# Patient Record
Sex: Female | Born: 1935 | Race: White | Hispanic: No | Smoking: Never smoker
Health system: Southern US, Community
[De-identification: ages and names within clinical notes are randomized; demographics above are authoritative.]

## PROBLEM LIST (undated history)

## (undated) DIAGNOSIS — F0390 Unspecified dementia without behavioral disturbance: Secondary | ICD-10-CM

## (undated) DIAGNOSIS — G459 Transient cerebral ischemic attack, unspecified: Secondary | ICD-10-CM

## (undated) DIAGNOSIS — I340 Nonrheumatic mitral (valve) insufficiency: Secondary | ICD-10-CM

## (undated) DIAGNOSIS — G4700 Insomnia, unspecified: Secondary | ICD-10-CM

## (undated) DIAGNOSIS — I10 Essential (primary) hypertension: Secondary | ICD-10-CM

## (undated) DIAGNOSIS — M1990 Unspecified osteoarthritis, unspecified site: Secondary | ICD-10-CM

## (undated) DIAGNOSIS — E7800 Pure hypercholesterolemia, unspecified: Secondary | ICD-10-CM

## (undated) HISTORY — PX: ABDOMINAL HYSTERECTOMY: SHX81

## (undated) HISTORY — PX: BREAST BIOPSY: SHX20

---

## 2012-04-16 ENCOUNTER — Emergency Department (HOSPITAL_COMMUNITY): Payer: Medicare Other

## 2012-04-16 ENCOUNTER — Inpatient Hospital Stay (HOSPITAL_COMMUNITY)
Admission: EM | Admit: 2012-04-16 | Discharge: 2012-04-19 | DRG: 202 | Disposition: A | Discharge status: Skilled Nursing Facility | Payer: Medicare Other | Attending: Internal Medicine | Admitting: Internal Medicine

## 2012-04-16 ENCOUNTER — Encounter (HOSPITAL_COMMUNITY): Payer: Self-pay | Admitting: Emergency Medicine

## 2012-04-16 DIAGNOSIS — I441 Atrioventricular block, second degree: Secondary | ICD-10-CM | POA: Diagnosis present

## 2012-04-16 DIAGNOSIS — I214 Non-ST elevation (NSTEMI) myocardial infarction: Secondary | ICD-10-CM | POA: Diagnosis present

## 2012-04-16 DIAGNOSIS — R4182 Altered mental status, unspecified: Secondary | ICD-10-CM

## 2012-04-16 DIAGNOSIS — E871 Hypo-osmolality and hyponatremia: Secondary | ICD-10-CM

## 2012-04-16 DIAGNOSIS — I509 Heart failure, unspecified: Secondary | ICD-10-CM | POA: Diagnosis present

## 2012-04-16 DIAGNOSIS — I10 Essential (primary) hypertension: Secondary | ICD-10-CM

## 2012-04-16 DIAGNOSIS — I959 Hypotension, unspecified: Secondary | ICD-10-CM

## 2012-04-16 DIAGNOSIS — J069 Acute upper respiratory infection, unspecified: Secondary | ICD-10-CM | POA: Diagnosis present

## 2012-04-16 DIAGNOSIS — E785 Hyperlipidemia, unspecified: Secondary | ICD-10-CM

## 2012-04-16 DIAGNOSIS — G459 Transient cerebral ischemic attack, unspecified: Secondary | ICD-10-CM

## 2012-04-16 DIAGNOSIS — I5032 Chronic diastolic (congestive) heart failure: Secondary | ICD-10-CM | POA: Diagnosis present

## 2012-04-16 DIAGNOSIS — F0390 Unspecified dementia without behavioral disturbance: Secondary | ICD-10-CM | POA: Diagnosis present

## 2012-04-16 DIAGNOSIS — J209 Acute bronchitis, unspecified: Principal | ICD-10-CM | POA: Diagnosis present

## 2012-04-16 HISTORY — DX: Insomnia, unspecified: G47.00

## 2012-04-16 HISTORY — DX: Unspecified osteoarthritis, unspecified site: M19.90

## 2012-04-16 HISTORY — DX: Essential (primary) hypertension: I10

## 2012-04-16 HISTORY — DX: Pure hypercholesterolemia, unspecified: E78.00

## 2012-04-16 HISTORY — DX: Unspecified dementia, unspecified severity, without behavioral disturbance, psychotic disturbance, mood disturbance, and anxiety: F03.90

## 2012-04-16 HISTORY — DX: Transient cerebral ischemic attack, unspecified: G45.9

## 2012-04-16 HISTORY — DX: Nonrheumatic mitral (valve) insufficiency: I34.0

## 2012-04-16 LAB — URINE MICROSCOPIC-ADD ON

## 2012-04-16 LAB — URINALYSIS, ROUTINE W REFLEX MICROSCOPIC
Hgb urine dipstick: NEGATIVE
Nitrite: NEGATIVE
Specific Gravity, Urine: 1.012 (ref 1.005–1.030)
Urobilinogen, UA: 0.2 mg/dL (ref 0.0–1.0)

## 2012-04-16 LAB — CBC
Hemoglobin: 11.5 g/dL — ABNORMAL LOW (ref 12.0–15.0)
RBC: 3.79 MIL/uL — ABNORMAL LOW (ref 3.87–5.11)

## 2012-04-16 LAB — BASIC METABOLIC PANEL
GFR calc Af Amer: 51 mL/min — ABNORMAL LOW (ref >90)
GFR calc non Af Amer: 44 mL/min — ABNORMAL LOW (ref >90)
Potassium: 4 mEq/L (ref 3.5–5.1)
Sodium: 125 mEq/L — ABNORMAL LOW (ref 135–145)

## 2012-04-16 LAB — TROPONIN I: Troponin I: <0.3 ng/mL (ref <0.30)

## 2012-04-16 MED ORDER — SODIUM CHLORIDE 0.9 % IV BOLUS (SEPSIS)
1000.0000 mL | Freq: Once | INTRAVENOUS | Status: AC
  Administered 2012-04-16: 1000 mL via INTRAVENOUS

## 2012-04-16 MED ORDER — SODIUM CHLORIDE 0.9 % IJ SOLN
3.0000 mL | Freq: Two times a day (BID) | INTRAMUSCULAR | Status: DC
  Administered 2012-04-17 – 2012-04-18 (×2): 3 mL via INTRAVENOUS

## 2012-04-16 NOTE — ED Provider Notes (Signed)
History     CSN: [id number]  Arrival date & time 04/16/12  1925   First MD Initiated Contact with Patient 04/16/12 1955      Chief Complaint  Patient presents with  . Urinary Incontinence    (Consider location/radiation/quality/duration/timing/severity/associated sxs/prior treatment) HPI Level of level V caveat dementia patient sent here from nursing home as she became incontinent earlier today and has slurred speech per her daughter no treatment prior to coming here. Past Medical History  Diagnosis Date  . Dementia   . TIA (transient ischemic attack)   . Hypertension   . Mitral valve regurgitation   . Hypercholesteremia   . Osteoarthritis   . Insomnia     History reviewed. No pertinent past surgical history.  No family history on file.  History  Substance Use Topics  . Smoking status: Not on file  . Smokeless tobacco: Not on file  . Alcohol Use:     OB History    Grav Para Term Preterm Abortions TAB SAB Ect Mult Living                  Review of Systems  Unable to perform ROS: Dementia    Allergies  Review of patient's allergies indicates no known allergies.  Home Medications   Current Outpatient Rx  Name Route Sig Dispense Refill  . ASPIRIN EC 81 MG PO TBEC Oral Take 81 mg by mouth daily.    Marland Kitchen VITAMIN D 400 UNITS PO CAPS Oral Take 400 Units by mouth 2 (two) times daily.    Marland Kitchen CRANBERRY 425 MG PO CAPS Oral Take 1 capsule by mouth daily.    Marland Kitchen DIVALPROEX SODIUM 250 MG PO TBEC Oral Take 250 mg by mouth 2 (two) times daily.    Marland Kitchen GALANTAMINE HYDROBROMIDE 4 MG PO TABS Oral Take 4 mg by mouth daily.    Marland Kitchen LISINOPRIL-HYDROCHLOROTHIAZIDE 20-25 MG PO TABS Oral Take 1 tablet by mouth daily.    Marland Kitchen LORAZEPAM 0.5 MG PO TABS Oral Take 0.5 mg by mouth every 8 (eight) hours as needed. agitation    . OVER THE COUNTER MEDICATION Perianal Place 1 application around the anus once a week. Mix 1 tablespoon of vinegar to 1 pint water was over perineal area once weekly for  maintenance.    Marland Kitchen POTASSIUM CHLORIDE ER 10 MEQ PO TBCR Oral Take 30 mEq by mouth daily.    Marland Kitchen CIPROFLOXACIN HCL 500 MG PO TABS Oral Take 500 mg by mouth 2 (two) times daily. For 3 days.      BP 83/33  Pulse 74  Temp 99.3 F (37.4 C) (Rectal)  Resp 24  SpO2 96%  Physical Exam  Nursing note and vitals reviewed. Constitutional: She appears well-developed and well-nourished.  HENT:  Head: Normocephalic and atraumatic.  Eyes: Conjunctivae are normal. Pupils are equal, round, and reactive to light.  Neck: Neck supple. No tracheal deviation present. No thyromegaly present.  Cardiovascular: Normal rate and regular rhythm.   No murmur heard. Pulmonary/Chest: Effort normal and breath sounds normal.  Abdominal: Soft. Bowel sounds are normal. She exhibits no distension. There is no tenderness.  Genitourinary: Guaiac negative stool.  Musculoskeletal: Normal range of motion. She exhibits no edema and no tenderness.  Neurological: She is alert. Coordination normal.       Follow simple commands moves all extremities answers in one or 2 word responses  Skin: Skin is warm and dry. No rash noted.  Psychiatric: She has a normal mood and affect.  1120 pm pt alert moves all extremites,remains hypotensive ED Course  Procedures (including critical care time)  Date: 04/16/2012  Rate: 86  Rhythm: normal sinus rhythm  QRS Axis: normal  Intervals: normal  ST/T Wave abnormalities: nonspecific T wave changes  Conduction Disutrbances:none  Narrative Interpretation:   Old EKG Reviewed: none available  Labs Reviewed  TROPONIN I  URINALYSIS, ROUTINE W REFLEX MICROSCOPIC  BASIC METABOLIC PANEL  CBC   No results found.   No diagnosis found.  Results for orders placed during the hospital encounter of 04/16/12  TROPONIN I      Component Value Range   Troponin I <0.30  <0.30 ng/mL  URINALYSIS, ROUTINE W REFLEX MICROSCOPIC      Component Value Range   Color, Urine YELLOW  YELLOW   APPearance  CLEAR  CLEAR   Specific Gravity, Urine 1.012  1.005 - 1.030   pH 7.0  5.0 - 8.0   Glucose, UA NEGATIVE  NEGATIVE mg/dL   Hgb urine dipstick NEGATIVE  NEGATIVE   Bilirubin Urine NEGATIVE  NEGATIVE   Ketones, ur TRACE (*) NEGATIVE mg/dL   Protein, ur 30 (*) NEGATIVE mg/dL   Urobilinogen, UA 0.2  0.0 - 1.0 mg/dL   Nitrite NEGATIVE  NEGATIVE   Leukocytes, UA NEGATIVE  NEGATIVE  BASIC METABOLIC PANEL      Component Value Range   Sodium 125 (*) 135 - 145 mEq/L   Potassium 4.0  3.5 - 5.1 mEq/L   Chloride 92 (*) 96 - 112 mEq/L   CO2 21  19 - 32 mEq/L   Glucose, Bld 125 (*) 70 - 99 mg/dL   BUN 15  6 - 23 mg/dL   Creatinine, Ser 1.61 (*) 0.50 - 1.10 mg/dL   Calcium 8.3 (*) 8.4 - 10.5 mg/dL   GFR calc non Af Amer 44 (*) >90 mL/min   GFR calc Af Amer 51 (*) >90 mL/min  CBC      Component Value Range   WBC 14.9 (*) 4.0 - 10.5 K/uL   RBC 3.79 (*) 3.87 - 5.11 MIL/uL   Hemoglobin 11.5 (*) 12.0 - 15.0 g/dL   HCT 09.6 (*) 04.5 - 40.9 %   MCV 86.5  78.0 - 100.0 fL   MCH 30.3  26.0 - 34.0 pg   MCHC 35.1  30.0 - 36.0 g/dL   RDW 81.1  91.4 - 78.2 %   Platelets 141 (*) 150 - 400 K/uL  URINE MICROSCOPIC-ADD ON      Component Value Range   Squamous Epithelial / LPF RARE  RARE   WBC, UA 0-2  <3 WBC/hpf   RBC / HPF 0-2  <3 RBC/hpf   Bacteria, UA MANY (*) RARE   Urine-Other MUCOUS PRESENT     Ct Head Wo Contrast  04/16/2012  *RADIOLOGY REPORT*  Clinical Data: Altered mental status.  Slurred speech and confusion beginning 3 hours prior to scanning.  Weakness.  CT HEAD WITHOUT CONTRAST  Technique:  Contiguous axial images were obtained from the base of the skull through the vertex without contrast.  Comparison: None.  Findings: Diffuse cerebral and cerebellar atrophy.  Ventricular dilatation consistent with central atrophy.  Patchy low attenuation changes in the deep white matter consistent with small vessel ischemic change.  No mass effect or midline shift.  No abnormal extra-axial fluid collections.   Gray-white matter junctions are distinct.  Basal cisterns are not effaced.  No evidence of acute intracranial hemorrhage.  No displaced skull fractures.  Visualized paranasal sinuses  and mastoid air cells are not opacified. Vascular calcifications.  IMPRESSION: No acute intracranial abnormalities.  Chronic atrophy and small vessel ischemic change.  Original Report Authenticated By: Marlon Pel, M.D.     MDM  Spoke with Dr. Deanne Coffer admit step down unit. Intravenous hydration Hemodynamic monitoring Diagnosis #1 hypotension #2 altered mental status #3 hyponatremia   CRITICAL CARE Performed by: Doug Sou   Total critical care time: 30 minute  Critical care time was exclusive of separately billable procedures and treating other patients.  Critical care was necessary to treat or prevent imminent or life-threatening deterioration.  Critical care was time spent personally by me on the following activities: development of treatment plan with patient and/or surrogate as well as nursing, discussions with consultants, evaluation of patient's response to treatment, examination of patient, obtaining history from patient or surrogate, ordering and performing treatments and interventions, ordering and review of laboratory studies, ordering and review of radiographic studies, pulse oximetry and re-evaluation of patient's condition.     Doug Sou, MD 04/16/12 [phone]

## 2012-04-16 NOTE — ED Notes (Signed)
Family member at bedside informed me about pt hx of TIAs. Daughter states pt never had a slurred speech before. Slight left sided facial droop noted on appearance. Pt arm and leg strength equal and strong bilaterally. Physician informed.

## 2012-04-16 NOTE — ED Notes (Signed)
Phlebotomy at bedside.

## 2012-04-16 NOTE — H&P (Addendum)
Claudia Joyce is an 78 y.o. female.   Chief Complaint: altered ms HPI: 78 yo female with htn, hyperlipidemia, had ? Feeling faint and altered ms,  Confusion. ?wet herself.   Slight cough.  (dry)   Note that pt was placed on depakote,  By the physician in the memory care at [facility]. Denies fever, chills, cp, palp, sob, n/v, diarrhea, brbpr, black stool.  Brought to ED by EMS.  In ED u/s negative, pt was hypotensive 78/40,  Which responded to fluid.  Na=125,  CT brain noncontrast=>negative for any acute process. CXR =>no report available yet, ? Pt will be admitted for hypotension, altered ms, ? Near syncope.   Past Medical History  Diagnosis Date  . Dementia   . TIA (transient ischemic attack)   . Hypertension   . Mitral valve regurgitation   . Hypercholesteremia   . Osteoarthritis   . Insomnia     Past Surgical History  Procedure Date  . Abdominal hysterectomy     No family history on file. Social History:  reports that she has never smoked. She has never used smokeless tobacco. She reports that she does not drink alcohol or use illicit drugs.  Allergies: No Known Allergies   (Not in a hospital admission)  Results for orders placed during the hospital encounter of 04/16/12 (from the past 48 hour(s))  TROPONIN I     Status: Normal   Collection Time   04/16/12  8:54 PM      Component Value Range Comment   Troponin I <0.30  <0.30 ng/mL   BASIC METABOLIC PANEL     Status: Abnormal   Collection Time   04/16/12  8:54 PM      Component Value Range Comment   Sodium 125 (*) 135 - 145 mEq/L    Potassium 4.0  3.5 - 5.1 mEq/L    Chloride 92 (*) 96 - 112 mEq/L    CO2 21  19 - 32 mEq/L    Glucose, Bld 125 (*) 70 - 99 mg/dL    BUN 15  6 - 23 mg/dL    Creatinine, Ser 1.61 (*) 0.50 - 1.10 mg/dL    Calcium 8.3 (*) 8.4 - 10.5 mg/dL    GFR calc non Af Amer 44 (*) >90 mL/min    GFR calc Af Amer 51 (*) >90 mL/min   CBC     Status: Abnormal   Collection Time   04/16/12  8:54 PM   Component Value Range Comment   WBC 14.9 (*) 4.0 - 10.5 K/uL    RBC 3.79 (*) 3.87 - 5.11 MIL/uL    Hemoglobin 11.5 (*) 12.0 - 15.0 g/dL    HCT 09.6 (*) 04.5 - 46.0 %    MCV 86.5  78.0 - 100.0 fL    MCH 30.3  26.0 - 34.0 pg    MCHC 35.1  30.0 - 36.0 g/dL    RDW 40.9  81.1 - 91.4 %    Platelets 141 (*) 150 - 400 K/uL   URINALYSIS, ROUTINE W REFLEX MICROSCOPIC     Status: Abnormal   Collection Time   04/16/12 10:07 PM      Component Value Range Comment   Color, Urine YELLOW  YELLOW    APPearance CLEAR  CLEAR    Specific Gravity, Urine 1.012  1.005 - 1.030    pH 7.0  5.0 - 8.0    Glucose, UA NEGATIVE  NEGATIVE mg/dL    Hgb urine dipstick NEGATIVE  NEGATIVE  Bilirubin Urine NEGATIVE  NEGATIVE    Ketones, ur TRACE (*) NEGATIVE mg/dL    Protein, ur 30 (*) NEGATIVE mg/dL    Urobilinogen, UA 0.2  0.0 - 1.0 mg/dL    Nitrite NEGATIVE  NEGATIVE    Leukocytes, UA NEGATIVE  NEGATIVE   URINE MICROSCOPIC-ADD ON     Status: Abnormal   Collection Time   04/16/12 10:07 PM      Component Value Range Comment   Squamous Epithelial / LPF RARE  RARE    WBC, UA 0-2  <3 WBC/hpf    RBC / HPF 0-2  <3 RBC/hpf    Bacteria, UA MANY (*) RARE    Urine-Other MUCOUS PRESENT      Ct Head Wo Contrast  04/16/2012  *RADIOLOGY REPORT*  Clinical Data: Altered mental status.  Slurred speech and confusion beginning 3 hours prior to scanning.  Weakness.  CT HEAD WITHOUT CONTRAST  Technique:  Contiguous axial images were obtained from the base of the skull through the vertex without contrast.  Comparison: None.  Findings: Diffuse cerebral and cerebellar atrophy.  Ventricular dilatation consistent with central atrophy.  Patchy low attenuation changes in the deep white matter consistent with small vessel ischemic change.  No mass effect or midline shift.  No abnormal extra-axial fluid collections.  Gray-white matter junctions are distinct.  Basal cisterns are not effaced.  No evidence of acute intracranial hemorrhage.  No  displaced skull fractures.  Visualized paranasal sinuses and mastoid air cells are not opacified. Vascular calcifications.  IMPRESSION: No acute intracranial abnormalities.  Chronic atrophy and small vessel ischemic change.  Original Report Authenticated By: Marlon Pel, M.D.   Dg Chest Port 1 View  04/16/2012  *RADIOLOGY REPORT*  Clinical Data: Hypotension.  Cough and congestion.  PORTABLE CHEST - 1 VIEW  Comparison: None.  Findings: Slightly shallow inspiration.  Heart size and pulmonary vascularity are normal.  Interstitial changes in the lungs suggesting fibrosis or possibly edema.  No blunting of costophrenic angles.  No pneumothorax.  Degenerative changes in the spine and shoulders.  IMPRESSION: Probable interstitial fibrosis in the lungs.  No evidence of active pulmonary disease.  Original Report Authenticated By: Marlon Pel, M.D.    Review of Systems  Constitutional: Negative for fever, chills, weight loss, malaise/fatigue and diaphoresis.  HENT: Negative for hearing loss, ear pain, nosebleeds, congestion, neck pain, tinnitus and ear discharge.   Eyes: Negative for blurred vision, double vision, photophobia, pain, discharge and redness.  Respiratory: Negative for cough, hemoptysis, sputum production, shortness of breath and wheezing.   Cardiovascular: Negative for chest pain, palpitations, orthopnea, claudication, leg swelling and PND.  Gastrointestinal: Negative for heartburn, nausea, vomiting, abdominal pain, diarrhea, constipation, blood in stool and melena.  Genitourinary: Negative for dysuria, urgency, frequency, hematuria and flank pain.  Musculoskeletal: Negative for myalgias, back pain, joint pain and falls.  Skin: Negative for itching and rash.  Neurological: Negative for dizziness, tingling, tremors, sensory change, speech change, focal weakness, seizures, loss of consciousness, weakness and headaches.  Endo/Heme/Allergies: Negative for environmental allergies and  polydipsia. Does not bruise/bleed easily.  Psychiatric/Behavioral: Negative for depression, suicidal ideas, hallucinations and substance abuse. The patient is not nervous/anxious and does not have insomnia.     Blood pressure 88/39, pulse 64, temperature 99.3 F (37.4 C), temperature source Rectal, resp. rate 20, SpO2 93.00%. Physical Exam  Constitutional: She is oriented to person, place, and time. She appears well-developed and well-nourished. No distress.  HENT:  Head: Normocephalic and atraumatic.  Mouth/Throat: No oropharyngeal exudate.  Eyes: Conjunctivae are normal. Pupils are equal, round, and reactive to light. Right eye exhibits no discharge. Left eye exhibits no discharge. No scleral icterus.  Neck: Normal range of motion. Neck supple. No JVD present. No tracheal deviation present. No thyromegaly present.  Cardiovascular: Normal rate and regular rhythm.  Exam reveals no gallop and no friction rub.   No murmur heard. Respiratory: Effort normal and breath sounds normal. No stridor. No respiratory distress. She has no wheezes. She has no rales. She exhibits no tenderness.  GI: Soft. Bowel sounds are normal. She exhibits no distension and no mass. There is no tenderness. There is no rebound and no guarding.  Musculoskeletal: Normal range of motion. She exhibits no edema and no tenderness.  Lymphadenopathy:    She has no cervical adenopathy.  Neurological: She is alert and oriented to person, place, and time. She has normal reflexes. She displays normal reflexes. No cranial nerve deficit. She exhibits normal muscle tone. Coordination normal.  Skin: Skin is warm and dry. No rash noted. She is not diaphoretic. No erythema. No pallor.  Psychiatric:       Pt somnolent     Assessment/Plan AMS: b12, folate, esr, ana, tsh rpr.  Note recently started on depakote,  We r awaiting blood level,  Hold this for now,  D/c ativan due to somnolence.  Hypotension: check d dimer, lactic acid,  procalcitonin,  Check echo.  Near syncope: tele, cycle cardiac markers, check carotid u/s, cardiac echo Hyponatremia: check cortisol, serum osm, tsh, urine sodium , urine osm ILD on CXR ? Pulmonary edema: bnp Dementia: please further evaluate in am with [facility],  TIA: cont asa Hypertension: d/c lisinopril/hydrochlorothiazide due to hypotension. im aware that hydrochlorothiazide can lower soidum however she's been on this foreever    Pearson Grippe 04/16/2012, 11:32 PM

## 2012-04-16 NOTE — ED Notes (Signed)
Per EMS pt transported from [facility], per staff pt had episode of incontinence. New onset today. Pt awake, at baseline.

## 2012-04-16 NOTE — ED Notes (Signed)
Pt arrived via EMS sitting up on stretcher, alert, confused per norm. Report from [facility] pt had new onset incontinence today. Per [facility] staff BP before d/c 111/70, EMS BP 118/72.

## 2012-04-16 NOTE — ED Notes (Signed)
EKG printed and given to EDP Jacobowitz for review. No previous EKG available for comparison.

## 2012-04-17 ENCOUNTER — Encounter (HOSPITAL_COMMUNITY): Payer: Self-pay | Admitting: *Deleted

## 2012-04-17 DIAGNOSIS — E871 Hypo-osmolality and hyponatremia: Secondary | ICD-10-CM

## 2012-04-17 DIAGNOSIS — E785 Hyperlipidemia, unspecified: Secondary | ICD-10-CM

## 2012-04-17 DIAGNOSIS — R4182 Altered mental status, unspecified: Secondary | ICD-10-CM

## 2012-04-17 DIAGNOSIS — I369 Nonrheumatic tricuspid valve disorder, unspecified: Secondary | ICD-10-CM

## 2012-04-17 DIAGNOSIS — R55 Syncope and collapse: Secondary | ICD-10-CM

## 2012-04-17 LAB — CBC
Platelets: 119 10*3/uL — ABNORMAL LOW (ref 150–400)
RBC: 3.73 MIL/uL — ABNORMAL LOW (ref 3.87–5.11)
RDW: 14 % (ref 11.5–15.5)
WBC: 10.1 10*3/uL (ref 4.0–10.5)

## 2012-04-17 LAB — COMPREHENSIVE METABOLIC PANEL
Alkaline Phosphatase: 23 U/L — ABNORMAL LOW (ref 39–117)
BUN: 16 mg/dL (ref 6–23)
CO2: 21 mEq/L (ref 19–32)
Chloride: 97 mEq/L (ref 96–112)
Creatinine, Ser: 0.94 mg/dL (ref 0.50–1.10)
GFR calc non Af Amer: 53 mL/min — ABNORMAL LOW (ref >90)
Glucose, Bld: 92 mg/dL (ref 70–99)
Total Bilirubin: 0.6 mg/dL (ref 0.3–1.2)

## 2012-04-17 LAB — SODIUM, URINE, RANDOM: Sodium, Ur: 69 mEq/L

## 2012-04-17 LAB — MAGNESIUM: Magnesium: 1.6 mg/dL (ref 1.5–2.5)

## 2012-04-17 LAB — VALPROIC ACID LEVEL: Valproic Acid Lvl: <10 ug/mL — ABNORMAL LOW (ref 50.0–100.0)

## 2012-04-17 LAB — MRSA PCR SCREENING: MRSA by PCR: NEGATIVE

## 2012-04-17 LAB — CARDIAC PANEL(CRET KIN+CKTOT+MB+TROPI)
CK, MB: 2.8 ng/mL (ref 0.3–4.0)
CK, MB: 3.8 ng/mL (ref 0.3–4.0)
Relative Index: 3.6 — ABNORMAL HIGH (ref 0.0–2.5)
Total CK: 112 U/L (ref 7–177)
Total CK: 107 U/L (ref 7–177)
Troponin I: 0.39 ng/mL (ref <0.30)

## 2012-04-17 LAB — OSMOLALITY, URINE: Osmolality, Ur: 394 mOsm/kg (ref 390–1090)

## 2012-04-17 LAB — DIFFERENTIAL
Basophils Absolute: 0 10*3/uL (ref 0.0–0.1)
Eosinophils Relative: 0 % (ref 0–5)
Monocytes Absolute: 0.8 10*3/uL (ref 0.1–1.0)
Monocytes Relative: 8 % (ref 3–12)
Neutrophils Relative %: 85 % — ABNORMAL HIGH (ref 43–77)

## 2012-04-17 LAB — HEPATIC FUNCTION PANEL
AST: 24 U/L (ref 0–37)
Bilirubin, Direct: 0.1 mg/dL (ref 0.0–0.3)
Indirect Bilirubin: 0.4 mg/dL (ref 0.3–0.9)
Total Bilirubin: 0.5 mg/dL (ref 0.3–1.2)

## 2012-04-17 LAB — EXPECTORATED SPUTUM ASSESSMENT W GRAM STAIN, RFLX TO RESP C: Special Requests: NORMAL

## 2012-04-17 LAB — RPR: RPR Ser Ql: NONREACTIVE

## 2012-04-17 LAB — OSMOLALITY: Osmolality: 266 mOsm/kg — ABNORMAL LOW (ref 275–300)

## 2012-04-17 LAB — TSH: TSH: 0.438 u[IU]/mL (ref 0.350–4.500)

## 2012-04-17 MED ORDER — SODIUM CHLORIDE 0.9 % IV SOLN
INTRAVENOUS | Status: DC
  Administered 2012-04-17: 02:00:00 via INTRAVENOUS

## 2012-04-17 MED ORDER — ASPIRIN EC 81 MG PO TBEC
81.0000 mg | DELAYED_RELEASE_TABLET | Freq: Every day | ORAL | Status: DC
  Administered 2012-04-17 – 2012-04-19 (×3): 81 mg via ORAL
  Filled 2012-04-17 (×4): qty 1

## 2012-04-17 MED ORDER — ATORVASTATIN CALCIUM 80 MG PO TABS
80.0000 mg | ORAL_TABLET | Freq: Every day | ORAL | Status: DC
  Administered 2012-04-17 – 2012-04-18 (×2): 80 mg via ORAL
  Filled 2012-04-17 (×5): qty 1

## 2012-04-17 MED ORDER — POTASSIUM CHLORIDE IN NACL 40-0.9 MEQ/L-% IV SOLN
INTRAVENOUS | Status: AC
  Administered 2012-04-17: 09:00:00 via INTRAVENOUS
  Filled 2012-04-17: qty 1000

## 2012-04-17 MED ORDER — METOPROLOL TARTRATE 12.5 MG HALF TABLET
12.5000 mg | ORAL_TABLET | Freq: Two times a day (BID) | ORAL | Status: DC
  Administered 2012-04-18: 12.5 mg via ORAL
  Filled 2012-04-17 (×5): qty 1

## 2012-04-17 MED ORDER — ENOXAPARIN SODIUM 40 MG/0.4ML ~~LOC~~ SOLN
40.0000 mg | SUBCUTANEOUS | Status: DC
  Administered 2012-04-19: 40 mg via SUBCUTANEOUS
  Filled 2012-04-17 (×4): qty 0.4

## 2012-04-17 MED ORDER — DOXYCYCLINE HYCLATE 100 MG PO CAPS
200.0000 mg | ORAL_CAPSULE | Freq: Once | ORAL | Status: AC
  Administered 2012-04-17: 200 mg via ORAL
  Filled 2012-04-17: qty 2

## 2012-04-17 MED ORDER — GALANTAMINE HYDROBROMIDE 4 MG PO TABS
4.0000 mg | ORAL_TABLET | Freq: Every day | ORAL | Status: DC
Start: 2012-04-17 — End: 2012-04-19
  Administered 2012-04-17 – 2012-04-19 (×3): 4 mg via ORAL
  Filled 2012-04-17 (×4): qty 1

## 2012-04-17 MED ORDER — CHOLECALCIFEROL 10 MCG (400 UNIT) PO TABS
400.0000 [IU] | ORAL_TABLET | Freq: Two times a day (BID) | ORAL | Status: DC
  Administered 2012-04-17 – 2012-04-19 (×4): 400 [IU] via ORAL
  Filled 2012-04-17 (×8): qty 1

## 2012-04-17 MED ORDER — ASPIRIN 81 MG PO CHEW
324.0000 mg | CHEWABLE_TABLET | Freq: Once | ORAL | Status: AC
  Administered 2012-04-17: 324 mg via ORAL
  Filled 2012-04-17: qty 4

## 2012-04-17 MED ORDER — VITAMIN D 400 UNITS PO CAPS: 400.0000 [IU] | ORAL_CAPSULE | Freq: Two times a day (BID) | ORAL | Status: DC

## 2012-04-17 MED ORDER — LEVOFLOXACIN IN D5W 500 MG/100ML IV SOLN
500.0000 mg | INTRAVENOUS | Status: DC
  Administered 2012-04-17 – 2012-04-19 (×3): 500 mg via INTRAVENOUS
  Filled 2012-04-17 (×4): qty 100

## 2012-04-17 MED ORDER — ENOXAPARIN SODIUM 60 MG/0.6ML ~~LOC~~ SOLN
1.0000 mg/kg | Freq: Once | SUBCUTANEOUS | Status: AC
  Administered 2012-04-17: 04:00:00 via SUBCUTANEOUS
  Filled 2012-04-17: qty 0.6

## 2012-04-17 MED ORDER — ASPIRIN EC 325 MG PO TBEC
325.0000 mg | DELAYED_RELEASE_TABLET | Freq: Every day | ORAL | Status: DC
  Filled 2012-04-17: qty 1

## 2012-04-17 NOTE — ED Notes (Signed)
Claudia Joyce, Lab: critical value 0.39 Troponin

## 2012-04-17 NOTE — Progress Notes (Signed)
CRITICAL VALUE ALERT  Critical value received: troponin 0.82  Date of notification: 04/17/2012  Time of notification: 0820  Critical value read back:yes  Nurse who received alert:  Lorrin Jackson RN  MD notified (1st page):  Dr. Thedore Mins  Time of first page:  0825  MD notified (2nd page):n/a  Time of second page:n/a  Responding MD:  Dr. Thedore Mins  Time MD responded: 0830

## 2012-04-17 NOTE — Progress Notes (Addendum)
[facility]                                                                                Patient Demographics  Claudia Joyce, is a 78 y.o. female  ZOX:[id number]  [id number]:[id number]  DOB - 05-Jun-1924  Admit date - 04/16/2012  Admitting Physician Massie Maroon, MD  Outpatient Primary MD for the patient is Myra Rude, MD  LOS - 1   Chief Complaint  Patient presents with  . Urinary Incontinence        Assessment & Plan   1. Dehydration from decreased by mouth intake causing hyponatremia, hypotension decrease in mental status in a patient who is already demented - improving with gentle IV fluids we'll continue, patient does have a dry cough with a stable chest x-ray, apparently multiple members at this facility have a URI, we'll treat her for bronchitis, sputum cultures ordered empiric Levaquin. OC monitor BMP sodium pressure who will normal saline.    2. Hypotension and hyponatremia- do to dehydration from decreased by mouth intake as in #1 above - proven with IV fluids continue. Old off on home blood pressure medications.     3.  Hyperlipidemia - new home dose statin.    4. Questionable TIA (transient ischemic attack) - secondary to decreased mental status I doubt this is the case, her head CT is unremarkable, she is on aspirin which will be continued, she has no focal neurological deficit, after detailed discussion with daughter it was found that patient already has progressive advanced dementia and likely her decreased mental status from baseline was due to profound hypotension. Echo and carotids are done. Aspirin and statin to continue.    5. Mild increase in troponin - agent is chest pain-free EKG is stable, likely mild troponin leak due to profound hypotension upon admission, will cycle troponin to look for troponin diuretics, aspirin statin low-dose beta blocker if tolerated by blood pressure to continue. Go to evaluate wall motion. Have discussed  with Dr. Ronelle Nigh cardiologist on call who has nothing else to offer at this time and agrees with present management. Monitor her on a tele.    6. Questionable tic which was removed last night by the nursing staff- he was not identified, discussed with Dr. Daiva Eves infectious disease, no further workup recommended. One time by mouth Doxy for Lyme prophylaxis given.     Code Status:  DO NOT RESUSCITATE  Family Communication: Discussed with daughter over the phone patient is DNR/DNI medical treatment only no heroics. If declines comfort care.  Disposition Plan: SNF    Procedures  Carotids, Echo, CT Brain    Consults none   Antibiotics  Levaquin, single dose Doxy (??tick bite)   Time Spent in minutes  35   DVT Prophylaxis  Lovenox    Leroy Sea M.D on 04/17/2012 at 11:42 AM  Between 7am to 7pm - Pager - [phone]  After 7pm go to [email or web address] - password [email or web address]  And look for the night coverage person covering for me after hours  [facility] Office  [phone]    Subjective:   Claudia Joyce today has, No headache, No chest pain, No abdominal pain -  No Nausea, No new weakness tingling or numbness, No Cough - SOB.   Objective:   Filed Vitals:   04/17/12 0400 04/17/12 0700 04/17/12 0800 04/17/12 1000  BP:  97/38 99/36 111/46  Pulse:  68 65 73  Temp: 99.5 F (37.5 C)  99.5 F (37.5 C)   TempSrc: Oral  Oral   Resp:  23 22 25   Height:      Weight:      SpO2:  93% 92% 92%    Wt Readings from Last 3 Encounters:  04/17/12 47.8 kg (105 lb 6.1 oz)     Intake/Output Summary (Last 24 hours) at 04/17/12 1142 Last data filed at 04/17/12 1000  Gross per 24 hour  Intake    720 ml  Output    350 ml  Net    370 ml    Exam Awake but not alert, No new F.N deficits, Normal affect [place].AT,PERRAL Supple Neck,No JVD, No cervical lymphadenopathy appriciated.  Symmetrical Chest wall movement, Good air movement bilaterally, CTAB RRR,No Gallops,Rubs  or new Murmurs, No Parasternal Heave +ve B.Sounds, Abd Soft, Non tender, No organomegaly appriciated, No rebound - guarding or rigidity. No Cyanosis, Clubbing or edema, No new Rash or bruise      Data Review   CBC  Lab 04/17/12 0720 04/16/12 2054  WBC 10.1 14.9*  HGB 11.2* 11.5*  HCT 31.9* 32.8*  PLT 119* 141*  MCV 85.5 86.5  MCH 30.0 30.3  MCHC 35.1 35.1  RDW 14.0 13.7  LYMPHSABS 0.7 --  MONOABS 0.8 --  EOSABS 0.0 --  BASOSABS 0.0 --  BANDABS -- --    Chemistries   Lab 04/17/12 0720 04/17/12 0006 04/16/12 2054  NA 128* -- 125*  K 3.3* -- 4.0  CL 97 -- 92*  CO2 21 -- 21  GLUCOSE 92 -- 125*  BUN 16 -- 15  CREATININE 0.94 -- 1.11*  CALCIUM 8.2* -- 8.3*  MG 1.6 -- --  AST 22 24 --  ALT 18 16 --  ALKPHOS 23* 23* --  BILITOT 0.6 0.5 --   ------------------------------------------------------------------------------------------------------------------ estimated creatinine clearance is 32.4 ml/min (by C-G formula based on Cr of 0.94). ------------------------------------------------------------------------------------------------------------------ No results found for this basename: HGBA1C:2 in the last 72 hours ------------------------------------------------------------------------------------------------------------------ No results found for this basename: CHOL:2,HDL:2,LDLCALC:2,TRIG:2,CHOLHDL:2,LDLDIRECT:2 in the last 72 hours ------------------------------------------------------------------------------------------------------------------ No results found for this basename: TSH,T4TOTAL,FREET3,T3FREE,THYROIDAB in the last 72 hours ------------------------------------------------------------------------------------------------------------------ No results found for this basename: VITAMINB12:2,FOLATE:2,FERRITIN:2,TIBC:2,IRON:2,RETICCTPCT:2 in the last 72 hours  Coagulation profile No results found for this basename: INR:5,PROTIME:5 in the last 168 hours  No results  found for this basename: DDIMER:2 in the last 72 hours  Cardiac Enzymes  Lab 04/17/12 0720 04/17/12 0006 04/16/12 2054  CKMB 3.8 2.8 --  TROPONINI 0.82* 0.39* <0.30  MYOGLOBIN -- -- --   ------------------------------------------------------------------------------------------------------------------ No components found with this basename: POCBNP:3  Micro Results Recent Results (from the past 240 hour(s))  MRSA PCR SCREENING     Status: Normal   Collection Time   04/17/12  2:29 AM      Component Value Range Status Comment   MRSA by PCR NEGATIVE  NEGATIVE Final     Radiology Reports Ct Head Wo Contrast  04/16/2012  *RADIOLOGY REPORT*  Clinical Data: Altered mental status.  Slurred speech and confusion beginning 3 hours prior to scanning.  Weakness.  CT HEAD WITHOUT CONTRAST  Technique:  Contiguous axial images were obtained from the base of the skull through the vertex without contrast.  Comparison: None.  Findings: Diffuse cerebral and cerebellar atrophy.  Ventricular dilatation consistent with central atrophy.  Patchy low attenuation changes in the deep white matter consistent with small vessel ischemic change.  No mass effect or midline shift.  No abnormal extra-axial fluid collections.  Gray-white matter junctions are distinct.  Basal cisterns are not effaced.  No evidence of acute intracranial hemorrhage.  No displaced skull fractures.  Visualized paranasal sinuses and mastoid air cells are not opacified. Vascular calcifications.  IMPRESSION: No acute intracranial abnormalities.  Chronic atrophy and small vessel ischemic change.  Original Report Authenticated By: Marlon Pel, M.D.   Dg Chest Port 1 View  04/16/2012  *RADIOLOGY REPORT*  Clinical Data: Hypotension.  Cough and congestion.  PORTABLE CHEST - 1 VIEW  Comparison: None.  Findings: Slightly shallow inspiration.  Heart size and pulmonary vascularity are normal.  Interstitial changes in the lungs suggesting fibrosis or  possibly edema.  No blunting of costophrenic angles.  No pneumothorax.  Degenerative changes in the spine and shoulders.  IMPRESSION: Probable interstitial fibrosis in the lungs.  No evidence of active pulmonary disease.  Original Report Authenticated By: Marlon Pel, M.D.    Scheduled Meds:   . aspirin  324 mg Oral Once  . aspirin EC  81 mg Oral Daily  . atorvastatin  80 mg Oral q1800  . cholecalciferol  400 Units Oral BID  . doxycycline  200 mg Oral Once  . enoxaparin (LOVENOX) injection  40 mg Subcutaneous Q24H  . enoxaparin  1 mg/kg Subcutaneous Once  . galantamine  4 mg Oral Daily  . levofloxacin (LEVAQUIN) IV  500 mg Intravenous Q24H  . metoprolol tartrate  12.5 mg Oral BID  . sodium chloride  1,000 mL Intravenous Once  . sodium chloride  1,000 mL Intravenous Once  . sodium chloride  3 mL Intravenous Q12H  . DISCONTD: aspirin EC  325 mg Oral Daily  . DISCONTD: Vitamin D  400 Units Oral BID   Continuous Infusions:   . 0.9 % NaCl with KCl 40 mEq / L 60 mL/hr at 04/17/12 0853  . DISCONTD: sodium chloride 60 mL/hr at 04/17/12 0500   PRN Meds:.

## 2012-04-17 NOTE — Progress Notes (Signed)
32 yof admitted through ED from [facility] with AMS and hypotension with hx of TIA, dementia, htn, mvr, elev cholesterol. Received 2L NS in ED. Follow commands as able, mild slurring of speech noted. Head CT negative for acute process, continue eval.

## 2012-04-17 NOTE — Progress Notes (Signed)
VASCULAR LAB PRELIMINARY  PRELIMINARY  PRELIMINARY  PRELIMINARY  Carotid Dopplers completed.    Preliminary report:  There is no ICA stenosis.  Vertebral artery flow is antegrade.  Cherell Colvin, 04/17/2012, 10:35 AM

## 2012-04-17 NOTE — ED Notes (Signed)
Pt daughter, [name] contact information (H) [phone].

## 2012-04-17 NOTE — Progress Notes (Signed)
  Echocardiogram 2D Echocardiogram has been performed.  Jorje Guild 04/17/2012, 8:47 AM

## 2012-04-17 NOTE — Progress Notes (Signed)
Rx:  Lovenox note  Pt with altered MS past hx of TIA ordered Lovenox for elevated troponins.  Paged Dr. Selena Batten- he felt pt not having acute TIA and wanted to proceed with full dose Lovenox vs. IV heparin. Lorenza Evangelist 04/17/2012 2:07 AM

## 2012-04-17 NOTE — Progress Notes (Signed)
ANTIBIOTIC CONSULT NOTE - INITIAL  Pharmacy Consult for levofloxacin Indication: brochitis  No Known Allergies  Patient Measurements: Height: 5\' 2"  (157.5 cm) Weight: 105 lb 6.1 oz (47.8 kg) IBW/kg (Calculated) : 50.1  Adjusted Body Weight:   Vital Signs: Temp: 99.5 F (37.5 C) (07/21 0800) Temp src: Oral (07/21 0800) BP: 99/36 mmHg (07/21 0800) Pulse Rate: 65  (07/21 0800) Intake/Output from previous day: 07/20 0701 - 07/21 0700 In: 360 [I.V.:360] Out: 350 [Urine:350] Intake/Output from this shift: Total I/O In: 180 [I.V.:180] Out: -   Labs:  Basename 04/17/12 0720 04/16/12 2054  WBC 10.1 14.9*  HGB 11.2* 11.5*  PLT 119* 141*  LABCREA -- --  CREATININE 0.94 1.11*   Estimated Creatinine Clearance: 32.4 ml/min (by C-G formula based on Cr of 0.94). No results found for this basename: VANCOTROUGH:2,VANCOPEAK:2,VANCORANDOM:2,GENTTROUGH:2,GENTPEAK:2,GENTRANDOM:2,TOBRATROUGH:2,TOBRAPEAK:2,TOBRARND:2,AMIKACINPEAK:2,AMIKACINTROU:2,AMIKACIN:2, in the last 72 hours   Microbiology: Recent Results (from the past 720 hour(s))  MRSA PCR SCREENING     Status: Normal   Collection Time   04/17/12  2:29 AM      Component Value Range Status Comment   MRSA by PCR NEGATIVE  NEGATIVE Final     Medical History: Past Medical History  Diagnosis Date  . Dementia   . TIA (transient ischemic attack)   . Hypertension   . Mitral valve regurgitation   . Hypercholesteremia   . Osteoarthritis   . Insomnia     Medications:  Anti-infectives     Start     Dose/Rate Route Frequency Ordered Stop   04/17/12 1000   levofloxacin (LEVAQUIN) IVPB 500 mg        500 mg 100 mL/hr over 60 Minutes Intravenous Every 24 hours 04/17/12 0938           Assessment: Patient with brochitis.  Goal of Therapy:  levofloxacin dosed based on patient weight and renal function   Plan:  Levofloxacin 500mg  iv q24hr  Aleene Davidson Crowford 04/17/2012,9:45 AM

## 2012-04-18 DIAGNOSIS — I959 Hypotension, unspecified: Secondary | ICD-10-CM

## 2012-04-18 DIAGNOSIS — I214 Non-ST elevation (NSTEMI) myocardial infarction: Secondary | ICD-10-CM

## 2012-04-18 DIAGNOSIS — I10 Essential (primary) hypertension: Secondary | ICD-10-CM

## 2012-04-18 LAB — CBC
Platelets: 117 10*3/uL — ABNORMAL LOW (ref 150–400)
RBC: 3.56 MIL/uL — ABNORMAL LOW (ref 3.87–5.11)
RDW: 13.9 % (ref 11.5–15.5)
WBC: 10.5 10*3/uL (ref 4.0–10.5)

## 2012-04-18 LAB — BASIC METABOLIC PANEL
CO2: 19 mEq/L (ref 19–32)
Calcium: 8 mg/dL — ABNORMAL LOW (ref 8.4–10.5)
Creatinine, Ser: 0.58 mg/dL (ref 0.50–1.10)
GFR calc Af Amer: >90 mL/min (ref >90)
GFR calc non Af Amer: 81 mL/min — ABNORMAL LOW (ref >90)
Sodium: 127 mEq/L — ABNORMAL LOW (ref 135–145)

## 2012-04-18 LAB — FOLATE RBC: RBC Folate: 668 ng/mL — ABNORMAL HIGH (ref >=366)

## 2012-04-18 MED ORDER — SODIUM CHLORIDE 0.9 % IV SOLN: INTRAVENOUS | Status: AC

## 2012-04-18 MED ORDER — POTASSIUM CHLORIDE 10 MEQ/100ML IV SOLN
10.0000 meq | INTRAVENOUS | Status: AC
  Administered 2012-04-18 (×4): 10 meq via INTRAVENOUS
  Filled 2012-04-18 (×7): qty 100

## 2012-04-18 MED ORDER — POTASSIUM CHLORIDE CRYS ER 20 MEQ PO TBCR
40.0000 meq | EXTENDED_RELEASE_TABLET | Freq: Once | ORAL | Status: AC
  Administered 2012-04-18: 40 meq via ORAL
  Filled 2012-04-18: qty 2

## 2012-04-18 MED ORDER — ACETAMINOPHEN 325 MG PO TABS
650.0000 mg | ORAL_TABLET | Freq: Four times a day (QID) | ORAL | Status: DC | PRN
  Administered 2012-04-18: 650 mg via ORAL
  Filled 2012-04-18: qty 2

## 2012-04-18 NOTE — Progress Notes (Addendum)
Patient had episode that showed second degree heart block, vs:  125/67, pulse = 77, resp = 20, O2 saturation = 96%, notified Dr, Thedore Mins of this event via telephone, EKG done and on chart for doctor review.  IV potassium behind due to lack of IV access, burning of medication per patient.  IV restarted and medication slowed down

## 2012-04-18 NOTE — Evaluation (Signed)
Clinical/Bedside Swallow Evaluation Patient Details  Name: Claudia Joyce MRN: [id number] Date of Birth: Nov 24, 1923  Today's Date: 04/18/2012 Time: 1020-1044 SLP Time Calculation (min): 24 min  Past Medical History:  Past Medical History  Diagnosis Date  . Dementia   . TIA (transient ischemic attack)   . Hypertension   . Mitral valve regurgitation   . Hypercholesteremia   . Osteoarthritis   . Insomnia    Past Surgical History:  Past Surgical History  Procedure Date  . Abdominal hysterectomy   . Breast biopsy    HPI:  78 yo female adm to [facility] 04/16/12 with AMS, ? TIA, dysarthria.  PMH + for dementia.  Head CT negative for acute, CXR 04/16/12 showed fibrosis no evidence of acute disease.     Assessment / Plan / Recommendation Clinical Impression  Pt observed self feeding Sprite and cracker, swallow appeared timely but intermittent cough noted with and without po intake, therefore can not definitively rule out aspiration.  Pt's CXR currently does not indicate a pna, therefore if pt is aspirating, she appears to be tolerating currently.  Note pt admitted with dehydration and do not wish to limit diet unless necessary.    SlP to follow up next date to establish premorbid swallow ability with facility and/or family as pt is nonverbal and has cognitive deficits.  No family present currently.     Rec continue current diet encouraging self feeding as able and monitoring closley for s/s of aspiration.  Pt from [facility] and from documentation refused medications on 04/15/12.      Aspiration Risk  Moderate    Diet Recommendation Regular;Thin liquid   Liquid Administration via: Straw Medication Administration: Other (Comment) (as tolerated and pt accepts) Supervision: Patient able to self feed;Full supervision/cueing for compensatory strategies Compensations: Slow rate;Small sips/bites;Check for pocketing Postural Changes and/or Swallow Maneuvers: Upright 30-60 min after meal;Seated  upright 90 degrees    Other  Recommendations Oral Care Recommendations: Oral care BID   Follow Up Recommendations       Frequency and Duration min 2x/week  2 weeks   Pertinent Vitals/Pain Cough - congested, afebrile    SLP Swallow Goals Patient will utilize recommended strategies during swallow to increase swallowing safety with: Total assistance   Swallow Study Prior Functional Status       General HPI: 78 yo female adm to [facility] 04[facility]/12 with AMS, ? TIA, dysarthria.  PMH + for dementia.  Head CT negative for acute, CXR 04/16/12 showed fibrosis no evidence of acute disease.   Previous Swallow Assessment: none seen in epic Diet Prior to this Study: Regular;Thin liquids Temperature Spikes Noted: No Respiratory Status: Room air History of Recent Intubation: No Behavior/Cognition: Alert;Doesn't follow directions;Distractible;Uncooperative;Other (comment) (pt has dementia) Oral Cavity - Dentition: Adequate natural dentition Self-Feeding Abilities: Able to feed self;Other (Comment) (needs encouragement) Patient Positioning: Upright in bed Baseline Vocal Quality: Clear Volitional Cough: Congested;Cognitively unable to elicit (congested reflexive cough) Volitional Swallow: Unable to elicit    Oral/Motor/Sensory Function Overall Oral Motor/Sensory Function: Appears within functional limits for tasks assessed (pt did not follow directions for oral motor exam) Labial Strength:  (able to seal lips on straw) Facial ROM: Within Functional Limits Velum:  (DNT )   Ice Chips Ice chips: Not tested   Thin Liquid Thin Liquid: Within functional limits Presentation: Self Fed;Straw Other Comments: cough with and without intake    Nectar Thick     Honey Thick     Puree Puree: Impaired Presentation: Spoon Oral Phase  Impairments: Impaired anterior to posterior transit;Reduced lingual movement/coordination Oral Phase Functional Implications: Prolonged oral transit Pharyngeal Phase Impairments:  Suspected delayed Swallow Other Comments: pt took only two small bites of icecream, 1/4 tsp,  cough with and without po intake   Solid Solid: Impaired Presentation: Self Fed Oral Phase Impairments: Reduced lingual movement/coordination;Impaired anterior to posterior transit Other Comments: extended mastication noted, cough after swallow - cough with and without po intake    Donavan Burnet, MS [facility] SLP [phone]

## 2012-04-18 NOTE — Progress Notes (Addendum)
[facility]                                                                                Patient Demographics  Claudia Joyce, is a 78 y.o. female  ZOX:[id number]  WJX:[id number]  DOB - 1923-12-30  Admit date - 04/16/2012  Admitting Physician Massie Maroon, MD  Outpatient Primary MD for the patient is Myra Rude, MD  LOS - 2   Chief Complaint  Patient presents with  . Urinary Incontinence        Assessment & Plan   1. Dehydration from decreased by mouth intake causing hyponatremia, hypotension decrease in mental status in a patient who is already demented - improving with gentle IV fluids we'll continue, patient does have a dry cough with a stable chest x-ray, apparently multiple members at this facility have a URI, we'll treat her for bronchitis, sputum cultures ordered empiric Levaquin. Already improved with IV fluids empiric Levaquin we'll continue.   2. Hypotension and hyponatremia- do to dehydration from decreased by mouth intake as in #1 above - proven with IV fluids continue. Old off on home blood pressure medications low-dose beta blocker with written parameters to hold, discussed her electrolytes with Dr. Hyman Hopes nephrologist on 04/18/2012 he recommends to continue gentle IV hydration, her electrolytes suggestive of dehydration in the presence of HCTZ use. Repeat BMP in the morning.     3.  Hyperlipidemia - continue home dose statin.    4. Questionable TIA (transient ischemic attack) - secondary to decreased mental status I doubt this is the case, her head CT is unremarkable, she is on aspirin which will be continued, she has no focal neurological deficit, after detailed discussion with daughter it was found that patient already has progressive advanced dementia and likely her decreased mental status from baseline was due to profound hypotension. Echo and carotids are stable. Aspirin and statin to continue.    5. Mild increase in troponin - agent  is chest pain-free EKG is stable, likely mild troponin leak due to profound hypotension upon admission, troponin kinetics suggestive of non-ACS pattern, continue aspirin statin low-dose beta blocker if tolerated by blood pressure to continue. Go to evaluate wall motion. Have discussed with Dr. Ronelle Nigh cardiologist on call who has nothing else to offer at this time and agrees with present management. Monitor her on a tele. Stable echogram.    6. Questionable tic exposure - which was removed admission night by the nursing staff- he was not identified, discussed with Dr. Daiva Eves infectious disease, no further workup recommended. One time by mouth Doxy for Lyme prophylaxis given.   7. Incidental finding of chronic diastolic CHF on echogram. Patient is clinically compensated in fact needs IV fluids at this time, beta blocker as tolerated.    Code Status:  DO NOT RESUSCITATE  Family Communication: Discussed with daughter over the phone patient is DNR/DNI medical treatment only no heroics. If declines comfort care.  Disposition Plan: SNF    Procedures  Carotids, Echo, CT Brain  Echo - Left ventricle: The cavity size was normal. Wall thickness was normal. Systolic function was normal. The estimated ejection fraction was in the range of 60% to  65%. Wall motion was normal; there were no regional wall motion abnormalities. Features are consistent with a pseudonormal left ventricular filling pattern, with concomitant abnormal relaxation and increased filling pressure (grade 2 diastolic dysfunction). - Mitral valve: Systolic bowing without prolapse. Mild regurgitation. - Tricuspid valve: Mild-moderate regurgitation. - Pulmonary arteries: Systolic pressure was moderately increased. PA peak pressure: 54mm Hg (S). - Pericardium, extracardiac: There was no pericardial effusion.    Carotids- stable  Consults none   Antibiotics  Levaquin, single dose Doxy (??tick bite)   Time Spent in minutes   35   DVT Prophylaxis  Lovenox    Leroy Sea M.D on 04/18/2012 at 12:27 PM  Between 7am to 7pm - Pager - [phone]  After 7pm go to [email or web address] - password [email or web address]  And look for the night coverage person covering for me after hours  [facility] Office  [phone]    Subjective:   Claudia Joyce today has, No headache, No chest pain, No abdominal pain - No Nausea, No new weakness tingling or numbness, No Cough - SOB.   Objective:   Filed Vitals:   04/17/12 1400 04/17/12 1500 04/17/12 2200 04/18/12 0643  BP: 111/27 111/52 111/64 125/73  Pulse: 86 81 82 84  Temp:   101.2 F (38.4 C) 97.3 F (36.3 C)  TempSrc:   Oral Axillary  Resp: 24 23 20 20   Height:      Weight:      SpO2: 98% 97% 94% 94%    Wt Readings from Last 3 Encounters:  04/17/12 47.8 kg (105 lb 6.1 oz)     Intake/Output Summary (Last 24 hours) at 04/18/12 1227 Last data filed at 04/18/12 0644  Gross per 24 hour  Intake    270 ml  Output    500 ml  Net   -230 ml    Exam Awake but not alert, No new F.N deficits, Normal affect [place].AT,PERRAL Supple Neck,No JVD, No cervical lymphadenopathy appriciated.  Symmetrical Chest wall movement, Good air movement bilaterally, CTAB RRR,No Gallops,Rubs or new Murmurs, No Parasternal Heave +ve B.Sounds, Abd Soft, Non tender, No organomegaly appriciated, No rebound - guarding or rigidity. No Cyanosis, Clubbing or edema, No new Rash or bruise      Data Review   CBC  Lab 04/18/12 0120 04/17/12 0720 04/16/12 2054  WBC 10.5 10.1 14.9*  HGB 10.7* 11.2* 11.5*  HCT 30.7* 31.9* 32.8*  PLT 117* 119* 141*  MCV 86.2 85.5 86.5  MCH 30.1 30.0 30.3  MCHC 34.9 35.1 35.1  RDW 13.9 14.0 13.7  LYMPHSABS -- 0.7 --  MONOABS -- 0.8 --  EOSABS -- 0.0 --  BASOSABS -- 0.0 --  BANDABS -- -- --    Chemistries   Lab 04/18/12 0120 04/17/12 0720 04/17/12 0006 04/16/12 2054  NA 127* 128* -- 125*  K 2.8* 3.3* -- 4.0  CL 96 97 -- 92*  CO2 19 21 -- 21   GLUCOSE 108* 92 -- 125*  BUN 11 16 -- 15  CREATININE 0.58 0.94 -- 1.11*  CALCIUM 8.0* 8.2* -- 8.3*  MG -- 1.6 -- --  AST -- 22 24 --  ALT -- 18 16 --  ALKPHOS -- 23* 23* --  BILITOT -- 0.6 0.5 --   ------------------------------------------------------------------------------------------------------------------ estimated creatinine clearance is 38.1 ml/min (by C-G formula based on Cr of 0.58). ------------------------------------------------------------------------------------------------------------------ No results found for this basename: HGBA1C:2 in the last 72 hours ------------------------------------------------------------------------------------------------------------------ No results found for this basename: CHOL:2,HDL:2,LDLCALC:2,TRIG:2,CHOLHDL:2,LDLDIRECT:2 in the last 72 hours ------------------------------------------------------------------------------------------------------------------  Basename 04/17/12 0006  TSH 0.438  T4TOTAL --  T3FREE --  THYROIDAB --   ------------------------------------------------------------------------------------------------------------------  Alvira Philips 04/17/12 0006  VITAMINB12 837  FOLATE --  FERRITIN --  TIBC --  IRON --  RETICCTPCT --    Coagulation profile No results found for this basename: INR:5,PROTIME:5 in the last 168 hours  No results found for this basename: DDIMER:2 in the last 72 hours  Cardiac Enzymes  Lab 04/18/12 0120 04/17/12 1851 04/17/12 0720 04/17/12 0006  CKMB -- 3.8 3.8 2.8  TROPONINI 0.64* 0.72* 0.82* --  MYOGLOBIN -- -- -- --   ------------------------------------------------------------------------------------------------------------------ No components found with this basename: POCBNP:3  Micro Results Recent Results (from the past 240 hour(s))  MRSA PCR SCREENING     Status: Normal   Collection Time   04/17/12  2:29 AM      Component Value Range Status Comment   MRSA by PCR NEGATIVE   NEGATIVE Final   CULTURE, RESPIRATORY     Status: Normal (Preliminary result)   Collection Time   04/17/12  2:00 PM      Component Value Range Status Comment   Specimen Description SPUTUM   Final    Special Requests NONE   Final    Gram Stain PENDING   Incomplete    Culture Culture reincubated for better growth   Final    Report Status PENDING   Incomplete   CULTURE, EXPECTORATED SPUTUM-ASSESSMENT     Status: Normal   Collection Time   04/17/12  2:13 PM      Component Value Range Status Comment   Specimen Description SPUTUM   Final    Special Requests Normal   Final    Sputum evaluation     Final    Value: THIS SPECIMEN IS ACCEPTABLE. RESPIRATORY CULTURE REPORT TO FOLLOW.   Report Status 04/17/2012 FINAL   Final     Radiology Reports Ct Head Wo Contrast  04/16/2012  *RADIOLOGY REPORT*  Clinical Data: Altered mental status.  Slurred speech and confusion beginning 3 hours prior to scanning.  Weakness.  CT HEAD WITHOUT CONTRAST  Technique:  Contiguous axial images were obtained from the base of the skull through the vertex without contrast.  Comparison: None.  Findings: Diffuse cerebral and cerebellar atrophy.  Ventricular dilatation consistent with central atrophy.  Patchy low attenuation changes in the deep white matter consistent with small vessel ischemic change.  No mass effect or midline shift.  No abnormal extra-axial fluid collections.  Gray-white matter junctions are distinct.  Basal cisterns are not effaced.  No evidence of acute intracranial hemorrhage.  No displaced skull fractures.  Visualized paranasal sinuses and mastoid air cells are not opacified. Vascular calcifications.  IMPRESSION: No acute intracranial abnormalities.  Chronic atrophy and small vessel ischemic change.  Original Report Authenticated By: Marlon Pel, M.D.   Dg Chest Port 1 View  04/16/2012  *RADIOLOGY REPORT*  Clinical Data: Hypotension.  Cough and congestion.  PORTABLE CHEST - 1 VIEW  Comparison: None.   Findings: Slightly shallow inspiration.  Heart size and pulmonary vascularity are normal.  Interstitial changes in the lungs suggesting fibrosis or possibly edema.  No blunting of costophrenic angles.  No pneumothorax.  Degenerative changes in the spine and shoulders.  IMPRESSION: Probable interstitial fibrosis in the lungs.  No evidence of active pulmonary disease.  Original Report Authenticated By: Marlon Pel, M.D.    Scheduled Meds:    . aspirin EC  81 mg Oral Daily  . atorvastatin  80  mg Oral q1800  . cholecalciferol  400 Units Oral BID  . doxycycline  200 mg Oral Once  . enoxaparin (LOVENOX) injection  40 mg Subcutaneous Q24H  . galantamine  4 mg Oral Daily  . levofloxacin (LEVAQUIN) IV  500 mg Intravenous Q24H  . metoprolol tartrate  12.5 mg Oral BID  . potassium chloride  10 mEq Intravenous Q1 Hr x 4  . potassium chloride  40 mEq Oral Once  . sodium chloride  3 mL Intravenous Q12H   Continuous Infusions:    . sodium chloride    . 0.9 % NaCl with KCl 40 mEq / L 50 mL/hr at 04/17/12 1142   PRN Meds:.acetaminophen

## 2012-04-18 NOTE — Consult Note (Signed)
Reason for Consult:Heart block Referring Physician: Dr. Jannifer Rodney Claudia Joyce is an 78 y.o. female.  HPI: This elderly demented woman was admitted with profound hypotension, na=125, increased confusion and weakness.  No history of CAD.  Patient has severe dementia and is unable to provide any history. Past history of hypertension and mitral valve regurgitation. Patient denies any chest pain.  Troponins are mildly elevated consistent with demand ischemia from profound hypotension. Today patient had a short run of second degree block. She is very hypokalemic with last K 2.8.  Past Medical History  Diagnosis Date  . Dementia   . TIA (transient ischemic attack)   . Hypertension   . Mitral valve regurgitation   . Hypercholesteremia   . Osteoarthritis   . Insomnia     Past Surgical History  Procedure Date  . Abdominal hysterectomy   . Breast biopsy     History reviewed. No pertinent family history.  Social History:  reports that she has never smoked. She has never used smokeless tobacco. She reports that she does not drink alcohol or use illicit drugs.  Allergies: No Known Allergies  Medications: I have reviewed the patient's current medications.  Results for orders placed during the hospital encounter of 04/16/12 (from the past 48 hour(s))  TROPONIN I     Status: Normal   Collection Time   04/16/12  8:54 PM      Component Value Range Comment   Troponin I <0.30  <0.30 ng/mL   BASIC METABOLIC PANEL     Status: Abnormal   Collection Time   04/16/12  8:54 PM      Component Value Range Comment   Sodium 125 (*) 135 - 145 mEq/L    Potassium 4.0  3.5 - 5.1 mEq/L    Chloride 92 (*) 96 - 112 mEq/L    CO2 21  19 - 32 mEq/L    Glucose, Bld 125 (*) 70 - 99 mg/dL    BUN 15  6 - 23 mg/dL    Creatinine, Ser 1.61 (*) 0.50 - 1.10 mg/dL    Calcium 8.3 (*) 8.4 - 10.5 mg/dL    GFR calc non Af Amer 44 (*) >90 mL/min    GFR calc Af Amer 51 (*) >90 mL/min   CBC     Status: Abnormal   Collection Time   04/16/12  8:54 PM      Component Value Range Comment   WBC 14.9 (*) 4.0 - 10.5 K/uL    RBC 3.79 (*) 3.87 - 5.11 MIL/uL    Hemoglobin 11.5 (*) 12.0 - 15.0 g/dL    HCT 09.6 (*) 04.5 - 46.0 %    MCV 86.5  78.0 - 100.0 fL    MCH 30.3  26.0 - 34.0 pg    MCHC 35.1  30.0 - 36.0 g/dL    RDW 40.9  81.1 - 91.4 %    Platelets 141 (*) 150 - 400 K/uL   VALPROIC ACID LEVEL     Status: Abnormal   Collection Time   04/16/12  8:54 PM      Component Value Range Comment   Valproic Acid Lvl <10.0 (*) 50.0 - 100.0 ug/mL   URINALYSIS, ROUTINE W REFLEX MICROSCOPIC     Status: Abnormal   Collection Time   04/16/12 10:07 PM      Component Value Range Comment   Color, Urine YELLOW  YELLOW    APPearance CLEAR  CLEAR    Specific Gravity, Urine 1.012  1.005 -  1.030    pH 7.0  5.0 - 8.0    Glucose, UA NEGATIVE  NEGATIVE mg/dL    Hgb urine dipstick NEGATIVE  NEGATIVE    Bilirubin Urine NEGATIVE  NEGATIVE    Ketones, ur TRACE (*) NEGATIVE mg/dL    Protein, ur 30 (*) NEGATIVE mg/dL    Urobilinogen, UA 0.2  0.0 - 1.0 mg/dL    Nitrite NEGATIVE  NEGATIVE    Leukocytes, UA NEGATIVE  NEGATIVE   URINE MICROSCOPIC-ADD ON     Status: Abnormal   Collection Time   04/16/12 10:07 PM      Component Value Range Comment   Squamous Epithelial / LPF RARE  RARE    WBC, UA 0-2  <3 WBC/hpf    RBC / HPF 0-2  <3 RBC/hpf    Bacteria, UA MANY (*) RARE    Urine-Other MUCOUS PRESENT     SODIUM, URINE, RANDOM     Status: Normal   Collection Time   04/16/12 10:07 PM      Component Value Range Comment   Sodium, Ur 69     OSMOLALITY, URINE     Status: Normal   Collection Time   04/16/12 10:07 PM      Component Value Range Comment   Osmolality, Ur 394  390 - 1090 mOsm/kg   VITAMIN B12     Status: Normal   Collection Time   04/17/12 12:06 AM      Component Value Range Comment   Vitamin B-12 837  211 - 911 pg/mL   FOLATE RBC     Status: Abnormal   Collection Time   04/17/12 12:06 AM      Component Value Range  Comment   RBC Folate 668 (*) >=366 ng/mL Reference range not established for pediatric patients.  ANA     Status: Normal   Collection Time   04/17/12 12:06 AM      Component Value Range Comment   ANA NEGATIVE  NEGATIVE   SEDIMENTATION RATE     Status: Normal   Collection Time   04/17/12 12:06 AM      Component Value Range Comment   Sed Rate 5  0 - 22 mm/hr   TSH     Status: Normal   Collection Time   04/17/12 12:06 AM      Component Value Range Comment   TSH 0.438  0.350 - 4.500 uIU/mL   RPR     Status: Normal   Collection Time   04/17/12 12:06 AM      Component Value Range Comment   RPR NON REACTIVE  NON REACTIVE   CORTISOL     Status: Normal   Collection Time   04/17/12 12:06 AM      Component Value Range Comment   Cortisol, Plasma 15.3     OSMOLALITY     Status: Abnormal   Collection Time   04/17/12 12:06 AM      Component Value Range Comment   Osmolality 266 (*) 275 - 300 mOsm/kg   HEPATIC FUNCTION PANEL     Status: Abnormal   Collection Time   04/17/12 12:06 AM      Component Value Range Comment   Total Protein 5.0 (*) 6.0 - 8.3 g/dL    Albumin 2.8 (*) 3.5 - 5.2 g/dL    AST 24  0 - 37 U/L    ALT 16  0 - 35 U/L    Alkaline Phosphatase 23 (*) 39 - 117 U/L  Total Bilirubin 0.5  0.3 - 1.2 mg/dL    Bilirubin, Direct 0.1  0.0 - 0.3 mg/dL    Indirect Bilirubin 0.4  0.3 - 0.9 mg/dL   CARDIAC PANEL(CRET KIN+CKTOT+MB+TROPI)     Status: Abnormal   Collection Time   04/17/12 12:06 AM      Component Value Range Comment   Total CK 112  7 - 177 U/L    CK, MB 2.8  0.3 - 4.0 ng/mL    Troponin I 0.39 (*) <0.30 ng/mL    Relative Index 2.5  0.0 - 2.5   PRO B NATRIURETIC PEPTIDE     Status: Abnormal   Collection Time   04/17/12 12:06 AM      Component Value Range Comment   Pro B Natriuretic peptide (BNP) 609.0 (*) 0 - 450 pg/mL   MRSA PCR SCREENING     Status: Normal   Collection Time   04/17/12  2:29 AM      Component Value Range Comment   MRSA by PCR NEGATIVE  NEGATIVE     COMPREHENSIVE METABOLIC PANEL     Status: Abnormal   Collection Time   04/17/12  7:20 AM      Component Value Range Comment   Sodium 128 (*) 135 - 145 mEq/L    Potassium 3.3 (*) 3.5 - 5.1 mEq/L    Chloride 97  96 - 112 mEq/L    CO2 21  19 - 32 mEq/L    Glucose, Bld 92  70 - 99 mg/dL    BUN 16  6 - 23 mg/dL    Creatinine, Ser 1.61  0.50 - 1.10 mg/dL    Calcium 8.2 (*) 8.4 - 10.5 mg/dL    Total Protein 5.0 (*) 6.0 - 8.3 g/dL    Albumin 2.7 (*) 3.5 - 5.2 g/dL    AST 22  0 - 37 U/L    ALT 18  0 - 35 U/L    Alkaline Phosphatase 23 (*) 39 - 117 U/L    Total Bilirubin 0.6  0.3 - 1.2 mg/dL    GFR calc non Af Amer 53 (*) >90 mL/min    GFR calc Af Amer 62 (*) >90 mL/min   CBC     Status: Abnormal   Collection Time   04/17/12  7:20 AM      Component Value Range Comment   WBC 10.1  4.0 - 10.5 K/uL    RBC 3.73 (*) 3.87 - 5.11 MIL/uL    Hemoglobin 11.2 (*) 12.0 - 15.0 g/dL    HCT 09.6 (*) 04.5 - 46.0 %    MCV 85.5  78.0 - 100.0 fL    MCH 30.0  26.0 - 34.0 pg    MCHC 35.1  30.0 - 36.0 g/dL    RDW 40.9  81.1 - 91.4 %    Platelets 119 (*) 150 - 400 K/uL   DIFFERENTIAL     Status: Abnormal   Collection Time   04/17/12  7:20 AM      Component Value Range Comment   Neutrophils Relative 85 (*) 43 - 77 %    Lymphocytes Relative 7 (*) 12 - 46 %    Monocytes Relative 8  3 - 12 %    Eosinophils Relative 0  0 - 5 %    Basophils Relative 0  0 - 1 %    Neutro Abs 8.6 (*) 1.7 - 7.7 K/uL    Lymphs Abs 0.7  0.7 - 4.0 K/uL  Monocytes Absolute 0.8  0.1 - 1.0 K/uL    Eosinophils Absolute 0.0  0.0 - 0.7 K/uL    Basophils Absolute 0.0  0.0 - 0.1 K/uL    RBC Morphology STOMATOCYTES      WBC Morphology WHITE COUNT CONFIRMED ON SMEAR      Smear Review PLATELET CLUMPS NOTED ON SMEAR     CARDIAC PANEL(CRET KIN+CKTOT+MB+TROPI)     Status: Abnormal   Collection Time   04/17/12  7:20 AM      Component Value Range Comment   Total CK 109  7 - 177 U/L    CK, MB 3.8  0.3 - 4.0 ng/mL    Troponin I 0.82 (*)  <0.30 ng/mL    Relative Index 3.5 (*) 0.0 - 2.5   MAGNESIUM     Status: Normal   Collection Time   04/17/12  7:20 AM      Component Value Range Comment   Magnesium 1.6  1.5 - 2.5 mg/dL   CULTURE, RESPIRATORY     Status: Normal (Preliminary result)   Collection Time   04/17/12  2:00 PM      Component Value Range Comment   Specimen Description SPUTUM      Special Requests NONE      Gram Stain        Value: RARE WBC PRESENT, PREDOMINANTLY MONONUCLEAR     RARE SQUAMOUS EPITHELIAL CELLS PRESENT     NO ORGANISMS SEEN   Culture Culture reincubated for better growth      Report Status PENDING     CULTURE, EXPECTORATED SPUTUM-ASSESSMENT     Status: Normal   Collection Time   04/17/12  2:13 PM      Component Value Range Comment   Specimen Description SPUTUM      Special Requests Normal      Sputum evaluation        Value: THIS SPECIMEN IS ACCEPTABLE. RESPIRATORY CULTURE REPORT TO FOLLOW.   Report Status 04/17/2012 FINAL     CARDIAC PANEL(CRET KIN+CKTOT+MB+TROPI)     Status: Abnormal   Collection Time   04/17/12  6:51 PM      Component Value Range Comment   Total CK 107  7 - 177 U/L    CK, MB 3.8  0.3 - 4.0 ng/mL    Troponin I 0.72 (*) <0.30 ng/mL    Relative Index 3.6 (*) 0.0 - 2.5   TROPONIN I     Status: Abnormal   Collection Time   04/18/12  1:20 AM      Component Value Range Comment   Troponin I 0.64 (*) <0.30 ng/mL   BASIC METABOLIC PANEL     Status: Abnormal   Collection Time   04/18/12  1:20 AM      Component Value Range Comment   Sodium 127 (*) 135 - 145 mEq/L    Potassium 2.8 (*) 3.5 - 5.1 mEq/L    Chloride 96  96 - 112 mEq/L    CO2 19  19 - 32 mEq/L    Glucose, Bld 108 (*) 70 - 99 mg/dL    BUN 11  6 - 23 mg/dL    Creatinine, Ser 1.61  0.50 - 1.10 mg/dL    Calcium 8.0 (*) 8.4 - 10.5 mg/dL    GFR calc non Af Amer 81 (*) >90 mL/min    GFR calc Af Amer >90  >90 mL/min   CBC     Status: Abnormal   Collection Time   04/18/12  1:20 AM      Component Value Range Comment    WBC 10.5  4.0 - 10.5 K/uL    RBC 3.56 (*) 3.87 - 5.11 MIL/uL    Hemoglobin 10.7 (*) 12.0 - 15.0 g/dL    HCT 29.5 (*) 28.4 - 46.0 %    MCV 86.2  78.0 - 100.0 fL    MCH 30.1  26.0 - 34.0 pg    MCHC 34.9  30.0 - 36.0 g/dL    RDW 13.2  44.0 - 10.2 %    Platelets 117 (*) 150 - 400 K/uL     Ct Head Wo Contrast  04/16/2012  *RADIOLOGY REPORT*  Clinical Data: Altered mental status.  Slurred speech and confusion beginning 3 hours prior to scanning.  Weakness.  CT HEAD WITHOUT CONTRAST  Technique:  Contiguous axial images were obtained from the base of the skull through the vertex without contrast.  Comparison: None.  Findings: Diffuse cerebral and cerebellar atrophy.  Ventricular dilatation consistent with central atrophy.  Patchy low attenuation changes in the deep white matter consistent with small vessel ischemic change.  No mass effect or midline shift.  No abnormal extra-axial fluid collections.  Gray-white matter junctions are distinct.  Basal cisterns are not effaced.  No evidence of acute intracranial hemorrhage.  No displaced skull fractures.  Visualized paranasal sinuses and mastoid air cells are not opacified. Vascular calcifications.  IMPRESSION: No acute intracranial abnormalities.  Chronic atrophy and small vessel ischemic change.  Original Report Authenticated By: Marlon Pel, M.D.   Dg Chest Port 1 View  04/16/2012  *RADIOLOGY REPORT*  Clinical Data: Hypotension.  Cough and congestion.  PORTABLE CHEST - 1 VIEW  Comparison: None.  Findings: Slightly shallow inspiration.  Heart size and pulmonary vascularity are normal.  Interstitial changes in the lungs suggesting fibrosis or possibly edema.  No blunting of costophrenic angles.  No pneumothorax.  Degenerative changes in the spine and shoulders.  IMPRESSION: Probable interstitial fibrosis in the lungs.  No evidence of active pulmonary disease.  Original Report Authenticated By: Marlon Pel, M.D.    ROS not currently obtainable  because of her dementia.  Blood pressure 127/66, pulse 86, temperature 99.1 F (37.3 C), temperature source Oral, resp. rate 18, height 5\' 2"  (1.575 m), weight 105 lb 6.1 oz (47.8 kg), SpO2 97.00%. The patient appears to be in no distress. Smiles pleasantly to all questions.  Head and neck exam reveals that the pupils are equal and reactive.  The extraocular movements are full.  There is no scleral icterus.  Mouth and pharynx are benign.  No lymphadenopathy.  No carotid bruits.  The jugular venous pressure is normal.  Thyroid is not enlarged or tender.  Chest reveals bilateral rales consistent with her pulmonary fibrosis. Expansion of the chest is symmetrical.  Heart reveals no abnormal lift or heave.  First and second heart sounds are normal.  There is no murmur gallop rub or click.  The abdomen is soft and nontender.  Bowel sounds are normoactive.  There is no hepatosplenomegaly or mass.  There are no abdominal bruits.  Extremities reveal no phlebitis or edema.  Pedal pulses are good.  There is no cyanosis or clubbing.  Neurologic exam is normal strength and no lateralizing weakness.  No sensory deficits.  Integument reveals no rash  EKGs were reviewed from 7/21 and 7/22. There are nonspecific changes consistent with her hypokalemia.  No acute ischemic changes.  Telemetry strip from 1624 today shows short run of  second degree AV block.  Assessment/Plan: 1. AV conduction disorder probably secondary to hypokalemia with superimposed effect of metoprolol. 2. NSTEMI secondary to demand ischemia from hypotension.  No further workup of this indicated. 3. Severe chronic dementia. 4. Hypokalemia.   Plan: Continue K repletion. Stop metoprolol. Will follow.  Cassell Clement 04/18/2012, 5:56 PM

## 2012-04-18 NOTE — Progress Notes (Signed)
Potassium infusion slowed down due to burning

## 2012-04-18 NOTE — Progress Notes (Signed)
Clinical Social Work Department BRIEF PSYCHOSOCIAL ASSESSMENT 04/18/2012  Patient:  Claudia Joyce, Claudia Joyce     Account Number:  [id number]     Admit date:  04/16/2012  Clinical Social Worker:  Doroteo Glassman  Date/Time:  04/18/2012 10:23 AM  Referred by:  Physician  Date Referred:  04/18/2012 Referred for  ALF Placement   Other Referral:   Interview type:  Other - See comment Other interview type:   Attempted to meet with Pt.  Pt would not/unable to answer CSW's questions.  Contacted Pt's daughter, Mrs. Thomasena Edis.    PSYCHOSOCIAL DATA Living Status:  FACILITY Admitted from facility:  OTHER Level of care:  Assisted Living Primary support name:  [name] Primary support relationship to patient:  CHILD, ADULT Degree of support available:   Adequate    CURRENT CONCERNS Current Concerns  Post-Acute Placement   Other Concerns:    SOCIAL WORK ASSESSMENT / PLAN Attempted to meet with Pt.  Pt not able to answer questions.    Contacted Pt's daughter, Mrs. Thomasena Edis.    Per Mrs. Collins, Pt from [facility] and that Pt will d/c back to that facility.    Contacted facility.  Per facility, ok for Pt to return.   Assessment/plan status:  Psychosocial Support/Ongoing Assessment of Needs Other assessment/ plan:   Information/referral to community resources:   n/a    PATIENT'S/FAMILY'S RESPONSE TO PLAN OF CARE: Pt's daughter thanked CSW for time and assistance.  CSW to continue to follow.  Providence Crosby, LCSWA Clinical Social Work [phone]

## 2012-04-19 LAB — BASIC METABOLIC PANEL
CO2: 23 mEq/L (ref 19–32)
Chloride: 100 mEq/L (ref 96–112)
GFR calc Af Amer: >90 mL/min (ref >90)
Potassium: 3.6 mEq/L (ref 3.5–5.1)
Sodium: 132 mEq/L — ABNORMAL LOW (ref 135–145)

## 2012-04-19 LAB — CBC
HCT: 33.2 % — ABNORMAL LOW (ref 36.0–46.0)
MCV: 86.7 fL (ref 78.0–100.0)
RBC: 3.83 MIL/uL — ABNORMAL LOW (ref 3.87–5.11)
RDW: 14 % (ref 11.5–15.5)
WBC: 8.3 10*3/uL (ref 4.0–10.5)

## 2012-04-19 LAB — CULTURE, RESPIRATORY W GRAM STAIN: Culture: NORMAL

## 2012-04-19 MED ORDER — AMLODIPINE BESYLATE 5 MG PO TABS: 5.0000 mg | ORAL_TABLET | Freq: Every day | ORAL | Status: DC

## 2012-04-19 MED ORDER — ATORVASTATIN CALCIUM 10 MG PO TABS: 10.0000 mg | ORAL_TABLET | Freq: Every day | ORAL | Status: DC

## 2012-04-19 MED ORDER — FUROSEMIDE 10 MG/ML IJ SOLN
10.0000 mg | Freq: Once | INTRAMUSCULAR | Status: DC
  Filled 2012-04-19: qty 1

## 2012-04-19 MED ORDER — POTASSIUM CHLORIDE CRYS ER 20 MEQ PO TBCR
20.0000 meq | EXTENDED_RELEASE_TABLET | Freq: Once | ORAL | Status: DC
  Filled 2012-04-19: qty 1

## 2012-04-19 MED ORDER — LEVOFLOXACIN 500 MG PO TABS: 500.0000 mg | ORAL_TABLET | Freq: Every day | ORAL | Status: AC

## 2012-04-19 NOTE — Progress Notes (Signed)
Speech Language Pathology Dysphagia Treatment Patient Details Name: Claudia Joyce MRN: [id number] DOB: 1923-12-17 Today's Date: 04/19/2012 Time: 4696-2952 SLP Time Calculation (min): 21 min  Assessment / Plan / Recommendation Clinical Impression  Pt again observed feeding herself today, she consumed all of her fruit.  Pt without intake of liquids as she declined.  SLP phoned [facility] and spoke to representative Angie who has worked with pt.  Angie denied pt having dysphagia or overt coughing with intake.  She reports pt did have congestion 2-3 weeks ago and was treated with Robitussin.  SlP does not suspect cough is coorelated to aspiration, but do recommend to monitor closely.      Diet Recommendation  Continue with Current Diet: Dysphagia 3 (mechanical soft);Thin liquid    SLP Plan All goals met (pt being dc'd)   Pertinent Vitals/Pain Febrile overnight - treated with ibuprofen - no longer febrile, lungs with rhonchi/rales consistent with pulmonary fibrosis   Swallowing Goals  SLP Swallowing Goals Swallow Study Goal #2 - Progress: Met Goal #3: Pt takes small boluses independently and self feeds enhancing airway protection.      Dysphagia Treatment Treatment focused on: Skilled observation of diet tolerance Treatment Methods/Modalities: Skilled observation Patient observed directly with PO's: Yes Type of PO's observed: Regular (pt declined liquids offered) Feeding: Able to feed self with adaptive devices Oral Phase Signs & Symptoms:  (mastication was appropriate for raw fruit not excessive) Type of cueing:  (pt did not need cues, took small bites/sips)       Donavan Burnet, MS [facility] SLP [phone]

## 2012-04-19 NOTE — Progress Notes (Signed)
Called pt daughter [name] to notify that pt is ready for discharge.  No answer received. Will notify primary RN.

## 2012-04-19 NOTE — Progress Notes (Signed)
   Subjective:  Remains very demented.  Offers no complaints. Appears to be in no distress.  Objective:  Vital Signs in the last 24 hours: Temp:  [97.5 F (36.4 C)-99.1 F (37.3 C)] 97.5 F (36.4 C) (07/22 2210) Pulse Rate:  [83-86] 83  (07/22 2210) Resp:  [18] 18  (07/22 2210) BP: (127-153)/(66-80) 153/80 mmHg (07/22 2210) SpO2:  [96 %-97 %] 96 % (07/22 2210)  Intake/Output from previous day: 07/22 0701 - 07/23 0700 In: 920 [P.O.:420; IV Piggyback:500] Out: 1751 [Urine:1750; Stool:1] Intake/Output from this shift:       . aspirin EC  81 mg Oral Daily  . atorvastatin  80 mg Oral q1800  . cholecalciferol  400 Units Oral BID  . enoxaparin (LOVENOX) injection  40 mg Subcutaneous Q24H  . galantamine  4 mg Oral Daily  . levofloxacin (LEVAQUIN) IV  500 mg Intravenous Q24H  . potassium chloride  10 mEq Intravenous Q1 Hr x 4  . potassium chloride  40 mEq Oral Once  . sodium chloride  3 mL Intravenous Q12H  . DISCONTD: metoprolol tartrate  12.5 mg Oral BID      . sodium chloride      Physical Exam: The patient appears to be in no distress.  Head and neck exam reveals that the pupils are equal and reactive.  The extraocular movements are full.  There is no scleral icterus.  Mouth and pharynx are benign.  No lymphadenopathy.  No carotid bruits.  The jugular venous pressure is normal.  Thyroid is not enlarged or tender.  Chest reveals bilateral rales and rhonchi c/w her pulmonary fibrosis.  Heart reveals no abnormal lift or heave.  First and second heart sounds are normal.  There is no murmur gallop rub or click.  The abdomen is soft and nontender.  Bowel sounds are normoactive.  There is no hepatosplenomegaly or mass.  There are no abdominal bruits.  Extremities reveal no phlebitis or edema.  Pedal pulses are good.  There is no cyanosis or clubbing.  Neurologic exam is normal strength and no lateralizing weakness.  No sensory deficits. Severe dementia.  Integument reveals  no rash  Lab Results:  Basename 04/19/12 0445 04/18/12 0120  WBC 8.3 10.5  HGB 11.2* 10.7*  PLT 133* 117*    Basename 04/19/12 0445 04/18/12 0120  NA 132* 127*  K 3.6 2.8*  CL 100 96  CO2 23 19  GLUCOSE 98 108*  BUN 6 11  CREATININE 0.52 0.58    Basename 04/18/12 0120 04/17/12 1851  TROPONINI 0.64* 0.72*   Hepatic Function Panel  Basename 04/17/12 0720 04/17/12 0006  PROT 5.0* --  ALBUMIN 2.7* --  AST 22 --  ALT 18 --  ALKPHOS 23* --  BILITOT 0.6 --  BILIDIR -- 0.1  IBILI -- 0.4   No results found for this basename: CHOL in the last 72 hours No results found for this basename: PROTIME in the last 72 hours  Imaging: Imaging results have been reviewed  Cardiac Studies:  Assessment/Plan:  Patient Active Hospital Problem List: 1. No further episodes of AV block noted.    Plan: Continue to avoid AV blocking drugs. 2. Hypokalemia    Plan: Being repleted. K 3.6 3. NSTEMI    Plan: ASA, statins. No further workup  Okay to consider transfer back to facility soon.  Will sign off now. Please call for questions   LOS: 3 days    Cassell Clement 04/19/2012, 7:14 AM

## 2012-04-19 NOTE — Progress Notes (Addendum)
Called in report to [facility] @ [phone] and spoke with Theone Murdoch, care taker. Informed her that patient would be coming back by daughter's [name]) transportation.  She was informed of d/c instructions including new medications, and Yellow DNR form is in packet.   MCCLAIN, Keandra Medero L 04/19/2012. 12:37 PM

## 2012-04-19 NOTE — Discharge Instructions (Signed)
Follow with Primary MD Myra Rude, MD in 3 days   Get CBC, CMP, checked 3 days by Primary MD and again as instructed by your Primary MD. Get a 2 view Chest X ray done next visit. Get Medicines reviewed and adjusted.  Please request your Prim.MD to go over all Hospital Tests and Procedure/Radiological results at the follow up, please get all Hospital records sent to your Prim MD by signing hospital release before you go home.  Activity: As tolerated with Full fall precautions use walker/cane & assistance as needed   Diet:  Heart Healthy ,  Fluid restriction 1.8 lit/day, Aspiration precautions.  For Heart failure patients - Check your Weight same time everyday, if you gain over 2 pounds, or you develop in leg swelling, experience more shortness of breath or chest pain, call your Primary MD immediately. Follow Cardiac Low Salt Diet and 1.8 lit/day fluid restriction.  Disposition SNF  If you experience worsening of your admission symptoms, develop shortness of breath, life threatening emergency, suicidal or homicidal thoughts you must seek medical attention immediately by calling 911 or calling your MD immediately  if symptoms less severe.  You Must read complete instructions/literature along with all the possible adverse reactions/side effects for all the Medicines you take and that have been prescribed to you. Take any new Medicines after you have completely understood and accpet all the possible adverse reactions/side effects.   Do not drive if your were admitted for syncope or siezures until you have seen by Primary MD or a Neurologist and advised to drive.  Do not drive when taking Pain medications.    Do not take more than prescribed Pain, Sleep and Anxiety Medications  Special Instructions: If you have smoked or chewed Tobacco  in the last 2 yrs please stop smoking, stop any regular Alcohol  and or any Recreational drug use.  Wear Seat belts while driving.

## 2012-04-19 NOTE — Progress Notes (Signed)
Pt taken to SNF [facility] by daughter. Daughter took packet containing yellow form d/c summary, and explained d/c instructions with her to keep her informed.

## 2012-04-19 NOTE — Progress Notes (Signed)
Per MD, Pt ready for d/c.  Notified Pt's family and facility.  Pt's family to provide transportation.  Faxed FL2 and d/c summary to facility.  Confirmed receipt of aforementioned documents by facility.  Facility ready to receive Pt.  Pt to be d/c'd.  Providence Crosby, LCSWA Clinical Social Work [phone]

## 2012-04-19 NOTE — Discharge Summary (Addendum)
[facility]                                                                                   [name], is a 78 y.o. female  DOB [birth year]  MRN [id number].  Admission date:  04/16/2012  Discharge Date:  04/19/2012  Primary MD  Myra Rude, MD  Admitting Physician  Massie Maroon, MD  Admission Diagnosis  Hypotension [458.9] URINARY INCONTINENCE  Discharge Diagnosis     Active Problems:  Hypertension  Hyperlipidemia  TIA (transient ischemic attack)  Altered mental status  Hypotension  Hyponatremia    Past Medical History  Diagnosis Date  . Dementia   . TIA (transient ischemic attack)   . Hypertension   . Mitral valve regurgitation   . Hypercholesteremia   . Osteoarthritis   . Insomnia     Past Surgical History  Procedure Date  . Abdominal hysterectomy   . Breast biopsy      Recommendations for primary care physician for things to follow:   This check CBC BMP and a 2 view chest x-ray 3-4 days.  Monitor patient's fluid status closely. Avoid diuretics as she develops hyponatremia.  Avoid beta blocker/calcium channel blocker.   Discharge Diagnoses:   Active Problems:  Hypertension  Hyperlipidemia  TIA (transient ischemic attack)  Altered mental status  Hypotension  Hyponatremia    Discharge Condition: stable   Diet recommendation: See Discharge Instructions below   Consults Cardiology   History of present illness and  Hospital Course :    See H&P, Labs, Consult and Test reports for all details in brief, patient was admitted for decreased mental status (according to the daughter worsened by Ativan)  in a patient with history of underlying dementia, every 2 likely acute bronchitis/URI, patient was treated with Levaquin (4 more days)  with good results, she also had evidence of dehydration, hypotension and hyponatremia upon admission. Likely brought in by decreased by mouth intake and use of HCTZ, all almost completely  resolved after IV fluids.   Patient also had episode of non-STEMI for which she was seen by cardiologist, she had a stable echo gram showing chronic compensated diastolic CHF, medical treatment only was suggested, she was not able to tolerate beta blocker due to development of transient second-degree AV block. He will be discharged home on aspirin and low-dose statin outpatient cardiology followup, patient is not a candidate for invasive cardiac procedures or interventions.     There was a question of TIA however this was not the case patient's symptoms were brought on by URI, hypotension from dehydration.    Chronic grade 2 Diastolic CHF patient is clinically compensated monitor fluid intake, 2 g sodium diet with 1.8 L fluid restriction.    Patient's daughter was updated she wishes that if patient declines again in the future, family would want palliative care/hospice involved. The do not want to pursue any heroic measures if she declines again in the future.     Today   Subjective:   Claudia Joyce today has no headache,no chest abdominal pain,no new weakness tingling or numbness, feels much better wants to go home today.   Objective:  Blood pressure 146/77, pulse 69, temperature 98.8 F (37.1 C), temperature source Oral, resp. rate 18, height 5\' 2"  (1.575 m), weight 48.127 kg (106 lb 1.6 oz), SpO2 94.00%.   Intake/Output Summary (Last 24 hours) at 04/19/12 1334 Last data filed at 04/19/12 0700  Gross per 24 hour  Intake    880 ml  Output   1750 ml  Net   -870 ml    Exam Awake not alert, No new F.N deficits, Normal affect [place].AT,PERRAL Supple Neck,No JVD, No cervical lymphadenopathy appriciated.  Symmetrical Chest wall movement, Good air movement bilaterally, few basilar rales RRR,No Gallops,Rubs or new Murmurs, No Parasternal Heave +ve B.Sounds, Abd Soft, Non tender, No organomegaly appriciated, No rebound -guarding or rigidity. No Cyanosis, Clubbing or edema, No  new Rash or bruise  Data Review    Ct Head Wo Contrast  04/16/2012  *RADIOLOGY REPORT*  Clinical Data: Altered mental status.  Slurred speech and confusion beginning 3 hours prior to scanning.  Weakness.  CT HEAD WITHOUT CONTRAST  Technique:  Contiguous axial images were obtained from the base of the skull through the vertex without contrast.  Comparison: None.  Findings: Diffuse cerebral and cerebellar atrophy.  Ventricular dilatation consistent with central atrophy.  Patchy low attenuation changes in the deep white matter consistent with small vessel ischemic change.  No mass effect or midline shift.  No abnormal extra-axial fluid collections.  Gray-white matter junctions are distinct.  Basal cisterns are not effaced.  No evidence of acute intracranial hemorrhage.  No displaced skull fractures.  Visualized paranasal sinuses and mastoid air cells are not opacified. Vascular calcifications.  IMPRESSION: No acute intracranial abnormalities.  Chronic atrophy and small vessel ischemic change.  Original Report Authenticated By: Marlon Pel, M.D.   Dg Chest Port 1 View  04/16/2012  *RADIOLOGY REPORT*  Clinical Data: Hypotension.  Cough and congestion.  PORTABLE CHEST - 1 VIEW  Comparison: None.  Findings: Slightly shallow inspiration.  Heart size and pulmonary vascularity are normal.  Interstitial changes in the lungs suggesting fibrosis or possibly edema.  No blunting of costophrenic angles.  No pneumothorax.  Degenerative changes in the spine and shoulders.  IMPRESSION: Probable interstitial fibrosis in the lungs.  No evidence of active pulmonary disease.  Original Report Authenticated By: Marlon Pel, M.D.     Echo  - Left ventricle: The cavity size was normal. Wall thickness was normal. Systolic function was normal. The estimated ejection fraction was in the range of 60% to 65%. Wall motion was normal; there were no regional wall motion abnormalities. Features are consistent with a  pseudonormal left ventricular filling pattern, with concomitant abnormal relaxation and increased filling pressure (grade 2 diastolic dysfunction). - Mitral valve: Systolic bowing without prolapse. Mild regurgitation. - Tricuspid valve: Mild-moderate regurgitation. - Pulmonary arteries: Systolic pressure was moderately increased. PA peak pressure: 54mm Hg (S). - Pericardium, extracardiac: There was no pericardial effusion.   Carotids- stable   Recent Results (from the past 240 hour(s))  MRSA PCR SCREENING     Status: Normal   Collection Time   04/17/12  2:29 AM      Component Value Range Status Comment   MRSA by PCR NEGATIVE  NEGATIVE Final   CULTURE, RESPIRATORY     Status: Normal   Collection Time   04/17/12  2:00 PM      Component Value Range Status Comment   Specimen Description SPUTUM   Final    Special Requests NONE   Final  Gram Stain     Final    Value: RARE WBC PRESENT, PREDOMINANTLY MONONUCLEAR     RARE SQUAMOUS EPITHELIAL CELLS PRESENT     NO ORGANISMS SEEN   Culture NORMAL OROPHARYNGEAL FLORA   Final    Report Status 04/19/2012 FINAL   Final   CULTURE, EXPECTORATED SPUTUM-ASSESSMENT     Status: Normal   Collection Time   04/17/12  2:13 PM      Component Value Range Status Comment   Specimen Description SPUTUM   Final    Special Requests Normal   Final    Sputum evaluation     Final    Value: THIS SPECIMEN IS ACCEPTABLE. RESPIRATORY CULTURE REPORT TO FOLLOW.   Report Status 04/17/2012 FINAL   Final      CBC w Diff: Lab Results  Component Value Date   WBC 8.3 04/19/2012   HGB 11.2* 04/19/2012   HCT 33.2* 04/19/2012   PLT 133* 04/19/2012   LYMPHOPCT 7* 04/17/2012   MONOPCT 8 04/17/2012   EOSPCT 0 04/17/2012   BASOPCT 0 04/17/2012    CMP: Lab Results  Component Value Date   NA 132* 04/19/2012   K 3.6 04/19/2012   CL 100 04/19/2012   CO2 23 04/19/2012   BUN 6 04/19/2012   CREATININE 0.52 04/19/2012   PROT 5.0* 04/17/2012   ALBUMIN 2.7* 04/17/2012   BILITOT 0.6  04/17/2012   ALKPHOS 23* 04/17/2012   AST 22 04/17/2012   ALT 18 04/17/2012  .   Discharge Instructions     Follow with Primary MD Myra Rude, MD in 3 days   Get CBC, CMP, checked 3 days by Primary MD and again as instructed by your Primary MD. Get a 2 view Chest X ray done next visit. Get Medicines reviewed and adjusted.  Please request your Prim.MD to go over all Hospital Tests and Procedure/Radiological results at the follow up, please get all Hospital records sent to your Prim MD by signing hospital release before you go home.  Activity: As tolerated with Full fall precautions use walker/cane & assistance as needed   Diet:  Heart Healthy ,  Fluid restriction 1.8 lit/day, Aspiration precautions.  For Heart failure patients - Check your Weight same time everyday, if you gain over 2 pounds, or you develop in leg swelling, experience more shortness of breath or chest pain, call your Primary MD immediately. Follow Cardiac Low Salt Diet and 1.8 lit/day fluid restriction.  Disposition SNF  If you experience worsening of your admission symptoms, develop shortness of breath, life threatening emergency, suicidal or homicidal thoughts you must seek medical attention immediately by calling 911 or calling your MD immediately  if symptoms less severe.  You Must read complete instructions/literature along with all the possible adverse reactions/side effects for all the Medicines you take and that have been prescribed to you. Take any new Medicines after you have completely understood and accpet all the possible adverse reactions/side effects.   Do not drive if your were admitted for syncope or siezures until you have seen by Primary MD or a Neurologist and advised to drive.  Do not drive when taking Pain medications.    Do not take more than prescribed Pain, Sleep and Anxiety Medications  Special Instructions: If you have smoked or chewed Tobacco  in the last 2 yrs please stop smoking, stop  any regular Alcohol  and or any Recreational drug use.  Wear Seat belts while driving.  Follow-up Information    Follow  up with Myra Rude, MD. Schedule an appointment as soon as possible for a visit in 3 days.   Contact information:   [address] [place] [address] [phone]       Follow up with Cassell Clement, MD. Schedule an appointment as soon as possible for a visit in 1 week.   Contact information:   [address] [place] [address] [phone]            Discharge Medications   Medication List  As of 04/19/2012  1:34 PM   START taking these medications         amLODipine 5 MG tablet   Commonly known as: NORVASC   Take 1 tablet (5 mg total) by mouth daily.      atorvastatin 10 MG tablet   Commonly known as: LIPITOR   Take 1 tablet (10 mg total) by mouth daily.      levofloxacin 500 MG tablet   Commonly known as: LEVAQUIN   Take 1 tablet (500 mg total) by mouth daily.         CONTINUE taking these medications         aspirin EC 81 MG tablet      Cranberry 425 MG Caps      divalproex 250 MG DR tablet   Commonly known as: DEPAKOTE      galantamine 4 MG tablet   Commonly known as: RAZADYNE      OVER THE COUNTER MEDICATION      potassium chloride 10 MEQ tablet   Commonly known as: K-DUR      Vitamin D 400 UNITS capsule         STOP taking these medications         ciprofloxacin 500 MG tablet      lisinopril-hydrochlorothiazide 20-25 MG per tablet      LORazepam 0.5 MG tablet          Where to get your medications       Information on where to get these meds is not yet available. Ask your nurse or doctor.         amLODipine 5 MG tablet   atorvastatin 10 MG tablet   levofloxacin 500 MG tablet               Total Time in preparing paper work, data evaluation and todays exam - 35 minutes  Leroy Sea M.D on 04/19/2012 at 1:34 PM  [facility] Office  [phone]

## 2012-08-14 ENCOUNTER — Encounter (HOSPITAL_COMMUNITY): Payer: Self-pay

## 2012-08-14 ENCOUNTER — Emergency Department (HOSPITAL_COMMUNITY)
Admission: EM | Admit: 2012-08-14 | Discharge: 2012-08-14 | Disposition: A | Discharge status: Skilled Nursing Facility | Payer: MEDICARE | Attending: Emergency Medicine | Admitting: Emergency Medicine

## 2012-08-14 ENCOUNTER — Emergency Department (HOSPITAL_COMMUNITY): Payer: MEDICARE

## 2012-08-14 DIAGNOSIS — R55 Syncope and collapse: Secondary | ICD-10-CM | POA: Insufficient documentation

## 2012-08-14 DIAGNOSIS — Z7982 Long term (current) use of aspirin: Secondary | ICD-10-CM | POA: Insufficient documentation

## 2012-08-14 DIAGNOSIS — E7800 Pure hypercholesterolemia, unspecified: Secondary | ICD-10-CM | POA: Insufficient documentation

## 2012-08-14 DIAGNOSIS — Z79899 Other long term (current) drug therapy: Secondary | ICD-10-CM | POA: Insufficient documentation

## 2012-08-14 DIAGNOSIS — Z8673 Personal history of transient ischemic attack (TIA), and cerebral infarction without residual deficits: Secondary | ICD-10-CM | POA: Insufficient documentation

## 2012-08-14 DIAGNOSIS — I059 Rheumatic mitral valve disease, unspecified: Secondary | ICD-10-CM | POA: Insufficient documentation

## 2012-08-14 DIAGNOSIS — F0390 Unspecified dementia without behavioral disturbance: Secondary | ICD-10-CM | POA: Insufficient documentation

## 2012-08-14 DIAGNOSIS — G4700 Insomnia, unspecified: Secondary | ICD-10-CM | POA: Insufficient documentation

## 2012-08-14 DIAGNOSIS — I10 Essential (primary) hypertension: Secondary | ICD-10-CM | POA: Insufficient documentation

## 2012-08-14 LAB — BASIC METABOLIC PANEL
BUN: 13 mg/dL (ref 6–23)
CO2: 29 mEq/L (ref 19–32)
Calcium: 9.2 mg/dL (ref 8.4–10.5)
Chloride: 101 mEq/L (ref 96–112)
Creatinine, Ser: 0.61 mg/dL (ref 0.50–1.10)
Glucose, Bld: 89 mg/dL (ref 70–99)

## 2012-08-14 LAB — URINALYSIS, ROUTINE W REFLEX MICROSCOPIC
Glucose, UA: NEGATIVE mg/dL
Hgb urine dipstick: NEGATIVE
Ketones, ur: NEGATIVE mg/dL
Leukocytes, UA: NEGATIVE
Protein, ur: NEGATIVE mg/dL
pH: 8 (ref 5.0–8.0)

## 2012-08-14 LAB — CBC WITH DIFFERENTIAL/PLATELET
Basophils Relative: 1 % (ref 0–1)
Eosinophils Absolute: 0.1 10*3/uL (ref 0.0–0.7)
HCT: 37.1 % (ref 36.0–46.0)
Hemoglobin: 12.6 g/dL (ref 12.0–15.0)
Lymphs Abs: 1.3 10*3/uL (ref 0.7–4.0)
MCH: 31.1 pg (ref 26.0–34.0)
MCHC: 34 g/dL (ref 30.0–36.0)
Monocytes Absolute: 0.6 10*3/uL (ref 0.1–1.0)
Monocytes Relative: 13 % — ABNORMAL HIGH (ref 3–12)
Neutro Abs: 3 10*3/uL (ref 1.7–7.7)
Neutrophils Relative %: 60 % (ref 43–77)
RBC: 4.05 MIL/uL (ref 3.87–5.11)

## 2012-08-14 NOTE — ED Notes (Signed)
Staff from nursing home called to inform this nurse that patient has DNR in place at nursing home but staff forgot to send yellow DNR form to hospital to with patient.

## 2012-08-14 NOTE — ED Notes (Signed)
Pt discharged to nursing home with family. NAD 

## 2012-08-14 NOTE — ED Notes (Signed)
Per EMS, pt from [facility].  Staff reports was sitting in chair and pulled/pointed at her chest.  Staff reports she does this occasionally d/t her bra being tight.  Staff thought it was different than normal so they called 911.  Upon EMS arrival, pt appeared unresponsive.  Did not respond to sternal rub, or lifting eyelids.  When pt was stuck for CBG, pt responded and has been normal since.  Pt with hx of dementia.

## 2012-08-14 NOTE — ED Provider Notes (Signed)
History     CSN: [id number]  Arrival date & time 08/14/12  4098   First MD Initiated Contact with Patient 08/14/12 [phone]      Chief Complaint  Patient presents with  . Altered Mental Status    (Consider location/radiation/quality/duration/timing/severity/associated sxs/prior treatment) HPI Comments: Pt is an 56 you woman living in a nursing home who has fairly severe dementia.  She had pointed to her chest and nursing home personnel thought she had chest pain.  EMS was called, and they found pt unresponsive.  They did CBG and pt woke up.  Review of old charts shows she was admitted for altered mental status and had CHF, bronchitis, dehydration, hyponatremia in July 2013.  She has DNR status.  Pt is unable to give her own history because of her dementia.   Past Medical History  Diagnosis Date  . Dementia   . TIA (transient ischemic attack)   . Hypertension   . Mitral valve regurgitation   . Hypercholesteremia   . Osteoarthritis   . Insomnia     Past Surgical History  Procedure Date  . Abdominal hysterectomy   . Breast biopsy     No family history on file.  History  Substance Use Topics  . Smoking status: Never Smoker   . Smokeless tobacco: Never Used  . Alcohol Use: No    OB History    Grav Para Term Preterm Abortions TAB SAB Ect Mult Living                  Review of Systems  Unable to perform ROS: Dementia    Allergies  Review of patient's allergies indicates no known allergies.  Home Medications   Current Outpatient Rx  Name  Route  Sig  Dispense  Refill  . AMLODIPINE BESYLATE 5 MG PO TABS   Oral   Take 5 mg by mouth daily.         . ASPIRIN EC 81 MG PO TBEC   Oral   Take 81 mg by mouth daily.         . ATORVASTATIN CALCIUM 10 MG PO TABS   Oral   Take 10 mg by mouth daily.         Marland Kitchen VITAMIN D 400 UNITS PO CAPS   Oral   Take 400 Units by mouth 2 (two) times daily.         Marland Kitchen CRANBERRY 425 MG PO CAPS   Oral   Take 1 capsule by  mouth daily.         Marland Kitchen DIVALPROEX SODIUM 250 MG PO TBEC   Oral   Take 250 mg by mouth 2 (two) times daily.         Marland Kitchen GALANTAMINE HYDROBROMIDE 4 MG PO TABS   Oral   Take 4 mg by mouth daily.         Marland Kitchen LEVOFLOXACIN 750 MG PO TABS   Oral   Take 750 mg by mouth daily. For 10 days: started 08/08/12         . LORAZEPAM 0.5 MG PO TABS   Oral   Take 0.25 mg by mouth 3 (three) times daily as needed. For agitation         . OVER THE COUNTER MEDICATION   Perianal   Place 1 application around the anus once a week. Mix 1 tablespoon of vinegar to 1 pint water was over perineal area once weekly for maintenance.         Marland Kitchen  PHENYLEPHRINE-DM-GG 5-10-100 MG/5ML PO LIQD   Oral   Take 10 mLs by mouth every 6 (six) hours as needed. For cough         . POTASSIUM CHLORIDE ER 10 MEQ PO TBCR   Oral   Take 30 mEq by mouth daily.           BP 147/67  Pulse 100  Temp 98.3 F (36.8 C) (Oral)  Resp 24  SpO2 100%  Physical Exam  Nursing note and vitals reviewed. Constitutional: No distress.       Pleasantly demented elderly lady.  HENT:  Head: Normocephalic and atraumatic.  Right Ear: External ear normal.  Left Ear: External ear normal.  Mouth/Throat: Oropharynx is clear and moist.  Eyes: Conjunctivae normal and EOM are normal. Pupils are equal, round, and reactive to light.  Neck: Normal range of motion. Neck supple.  Cardiovascular: Normal rate, regular rhythm and normal heart sounds.   Pulmonary/Chest: Effort normal and breath sounds normal.  Abdominal: Soft. Bowel sounds are normal.  Musculoskeletal: Normal range of motion.  Neurological: She is alert.       No sensory or motor deficit.  Pt not oriented to place or time.  Skin: Skin is warm and dry.  Psychiatric: She has a normal mood and affect. Her behavior is normal.    ED Course  Procedures (including critical care time)   9:39 AM  Date: 08/14/2012  Rate: 70  Rhythm: normal sinus rhythm  QRS Axis: normal   Intervals: normal QRS:  Left atrial abnormality  ST/T Wave abnormalities: normal  Conduction Disutrbances:none  Narrative Interpretation: Borderline EKG  Old EKG Reviewed: unchanged  9:55 AM Pt seen --> physical exam performed.  Lab workup ordered.  Old charts reviewed.  11:56 AM Results for orders placed during the hospital encounter of 08/14/12  CBC WITH DIFFERENTIAL      Component Value Range   WBC 5.0  4.0 - 10.5 K/uL   RBC 4.05  3.87 - 5.11 MIL/uL   Hemoglobin 12.6  12.0 - 15.0 g/dL   HCT 16.1  09.6 - 04.5 %   MCV 91.6  78.0 - 100.0 fL   MCH 31.1  26.0 - 34.0 pg   MCHC 34.0  30.0 - 36.0 g/dL   RDW 40.9  81.1 - 91.4 %   Platelets 169  150 - 400 K/uL   Neutrophils Relative 60  43 - 77 %   Neutro Abs 3.0  1.7 - 7.7 K/uL   Lymphocytes Relative 25  12 - 46 %   Lymphs Abs 1.3  0.7 - 4.0 K/uL   Monocytes Relative 13 (*) 3 - 12 %   Monocytes Absolute 0.6  0.1 - 1.0 K/uL   Eosinophils Relative 2  0 - 5 %   Eosinophils Absolute 0.1  0.0 - 0.7 K/uL   Basophils Relative 1  0 - 1 %   Basophils Absolute 0.0  0.0 - 0.1 K/uL  BASIC METABOLIC PANEL      Component Value Range   Sodium 140  135 - 145 mEq/L   Potassium 3.3 (*) 3.5 - 5.1 mEq/L   Chloride 101  96 - 112 mEq/L   CO2 29  19 - 32 mEq/L   Glucose, Bld 89  70 - 99 mg/dL   BUN 13  6 - 23 mg/dL   Creatinine, Ser 7.82  0.50 - 1.10 mg/dL   Calcium 9.2  8.4 - 95.6 mg/dL   GFR calc non Af Amer 80 (*) >  90 mL/min   GFR calc Af Amer >90  >90 mL/min  URINALYSIS, ROUTINE W REFLEX MICROSCOPIC      Component Value Range   Color, Urine YELLOW  YELLOW   APPearance CLOUDY (*) CLEAR   Specific Gravity, Urine 1.013  1.005 - 1.030   pH 8.0  5.0 - 8.0   Glucose, UA NEGATIVE  NEGATIVE mg/dL   Hgb urine dipstick NEGATIVE  NEGATIVE   Bilirubin Urine NEGATIVE  NEGATIVE   Ketones, ur NEGATIVE  NEGATIVE mg/dL   Protein, ur NEGATIVE  NEGATIVE mg/dL   Urobilinogen, UA 0.2  0.0 - 1.0 mg/dL   Nitrite NEGATIVE  NEGATIVE   Leukocytes, UA NEGATIVE   NEGATIVE  POCT I-STAT TROPONIN I      Component Value Range   Troponin i, poc 0.00  0.00 - 0.08 ng/mL   Comment 3            Dg Chest 1 View  08/14/2012  *RADIOLOGY REPORT*  Clinical Data: Chest pain  CHEST - 1 VIEW  Comparison: 04/16/2012  Findings: Chronic interstitial markings.  Mild bibasilar opacities, likely atelectasis / scarring.  No pleural effusion or pneumothorax.  Cardiomediastinal silhouette is within normal limits.  IMPRESSION: No evidence of acute cardiopulmonary disease.   Original Report Authenticated By: Charline Bills, M.D.     Pt's lab workup is negative.  I reviewed her results with her daughter.  She can return to her facility, and resume her regular medications.   1. Syncope   2. Dementia       Carleene Cooper III, MD 08/14/12 1159

## 2012-09-10 ENCOUNTER — Emergency Department (HOSPITAL_COMMUNITY)
Admission: EM | Admit: 2012-09-10 | Discharge: 2012-09-10 | Disposition: A | Discharge status: Home / Self Care | Payer: Medicare Other | Attending: Emergency Medicine | Admitting: Emergency Medicine

## 2012-09-10 ENCOUNTER — Encounter (HOSPITAL_COMMUNITY): Payer: Self-pay | Admitting: *Deleted

## 2012-09-10 DIAGNOSIS — R51 Headache: Secondary | ICD-10-CM | POA: Insufficient documentation

## 2012-09-10 DIAGNOSIS — Z8679 Personal history of other diseases of the circulatory system: Secondary | ICD-10-CM | POA: Insufficient documentation

## 2012-09-10 DIAGNOSIS — S0003XA Contusion of scalp, initial encounter: Secondary | ICD-10-CM | POA: Insufficient documentation

## 2012-09-10 DIAGNOSIS — W19XXXA Unspecified fall, initial encounter: Secondary | ICD-10-CM

## 2012-09-10 DIAGNOSIS — Z79899 Other long term (current) drug therapy: Secondary | ICD-10-CM | POA: Insufficient documentation

## 2012-09-10 DIAGNOSIS — I10 Essential (primary) hypertension: Secondary | ICD-10-CM | POA: Insufficient documentation

## 2012-09-10 DIAGNOSIS — M1990 Unspecified osteoarthritis, unspecified site: Secondary | ICD-10-CM | POA: Insufficient documentation

## 2012-09-10 DIAGNOSIS — S9030XA Contusion of unspecified foot, initial encounter: Secondary | ICD-10-CM | POA: Insufficient documentation

## 2012-09-10 DIAGNOSIS — Z8673 Personal history of transient ischemic attack (TIA), and cerebral infarction without residual deficits: Secondary | ICD-10-CM | POA: Insufficient documentation

## 2012-09-10 DIAGNOSIS — E7800 Pure hypercholesterolemia, unspecified: Secondary | ICD-10-CM | POA: Insufficient documentation

## 2012-09-10 DIAGNOSIS — Z8669 Personal history of other diseases of the nervous system and sense organs: Secondary | ICD-10-CM | POA: Insufficient documentation

## 2012-09-10 DIAGNOSIS — F0390 Unspecified dementia without behavioral disturbance: Secondary | ICD-10-CM | POA: Insufficient documentation

## 2012-09-10 DIAGNOSIS — Y9389 Activity, other specified: Secondary | ICD-10-CM | POA: Insufficient documentation

## 2012-09-10 DIAGNOSIS — Z7982 Long term (current) use of aspirin: Secondary | ICD-10-CM | POA: Insufficient documentation

## 2012-09-10 DIAGNOSIS — Y929 Unspecified place or not applicable: Secondary | ICD-10-CM | POA: Insufficient documentation

## 2012-09-10 DIAGNOSIS — W1809XA Striking against other object with subsequent fall, initial encounter: Secondary | ICD-10-CM | POA: Insufficient documentation

## 2012-09-10 DIAGNOSIS — M25579 Pain in unspecified ankle and joints of unspecified foot: Secondary | ICD-10-CM | POA: Insufficient documentation

## 2012-09-10 LAB — URINALYSIS, ROUTINE W REFLEX MICROSCOPIC
Glucose, UA: NEGATIVE mg/dL
Hgb urine dipstick: NEGATIVE
pH: 8 (ref 5.0–8.0)

## 2012-09-10 LAB — CBC
Hemoglobin: 12.4 g/dL (ref 12.0–15.0)
MCH: 30 pg (ref 26.0–34.0)
MCV: 91.3 fL (ref 78.0–100.0)
Platelets: 139 10*3/uL — ABNORMAL LOW (ref 150–400)
RBC: 4.14 MIL/uL (ref 3.87–5.11)
WBC: 9.3 10*3/uL (ref 4.0–10.5)

## 2012-09-10 LAB — BASIC METABOLIC PANEL
CO2: 26 mEq/L (ref 19–32)
Chloride: 100 mEq/L (ref 96–112)
Creatinine, Ser: 0.53 mg/dL (ref 0.50–1.10)
Glucose, Bld: 92 mg/dL (ref 70–99)
Sodium: 139 mEq/L (ref 135–145)

## 2012-09-10 LAB — URINE MICROSCOPIC-ADD ON

## 2012-09-10 NOTE — ED Provider Notes (Addendum)
History  Scribed for Claudia Roots, MD, the patient was seen in room A05C/A05C. This chart was scribed by Candelaria Stagers. The patient's care started at 1:00 PM   CSN: [id number]  Arrival date & time 09/10/12  1202   First MD Initiated Contact with Patient 09/10/12 1258      Chief Complaint  Patient presents with  . Fall     The history is provided by the patient. The history is limited by the condition of the patient. No language interpreter was used.   Claudia Joyce is a 78 y.o. female who presents to the Emergency Department complaining of a fall that occurred earlier today at the assisted living facility where she resides.  Her daughter reports she became light headed and fell to the floor hitting her head.  Her daughter reports that her mental state appears normal.  She is also experiencing pain to the left foot that she has had images taken of - daughter states foot looks much better than prior, states 2 sets xrays neg for fracture. This is not a new sx.  Her daughter states it appears better.  Pt typically walks without assistance. Ms c/w baseline per  Family and ecf. Hx falls. No coumadin or anticoag use. No nv. Level 5 caveat - advanced dementia.      Past Medical History  Diagnosis Date  . Dementia   . TIA (transient ischemic attack)   . Hypertension   . Mitral valve regurgitation   . Hypercholesteremia   . Osteoarthritis   . Insomnia     Past Surgical History  Procedure Date  . Abdominal hysterectomy   . Breast biopsy     History reviewed. No pertinent family history.  History  Substance Use Topics  . Smoking status: Never Smoker   . Smokeless tobacco: Never Used  . Alcohol Use: No    OB History    Grav Para Term Preterm Abortions TAB SAB Ect Mult Living                  Review of Systems  Unable to perform ROS: Dementia  Gastrointestinal: Negative for vomiting.  Skin: Positive for color change (bruising to left foot).  Neurological: Positive for  headaches.  All other systems reviewed and are negative.  level 5 caveat - dementia  Allergies  Review of patient's allergies indicates no known allergies.  Home Medications   Current Outpatient Rx  Name  Route  Sig  Dispense  Refill  . AMLODIPINE BESYLATE 5 MG PO TABS   Oral   Take 5 mg by mouth daily.         . ASPIRIN EC 81 MG PO TBEC   Oral   Take 81 mg by mouth daily.         . ATORVASTATIN CALCIUM 10 MG PO TABS   Oral   Take 10 mg by mouth daily.         . CEPHALEXIN 500 MG PO CAPS   Oral   Take 500 mg by mouth 3 (three) times daily. Started 09/09/12         . VITAMIN D 400 UNITS PO CAPS   Oral   Take 400 Units by mouth 2 (two) times daily.         Marland Kitchen CRANBERRY 425 MG PO CAPS   Oral   Take 1 capsule by mouth daily.         Marland Kitchen DIVALPROEX SODIUM 250 MG PO TBEC   Oral  Take 250 mg by mouth 2 (two) times daily.         Marland Kitchen GALANTAMINE HYDROBROMIDE 4 MG PO TABS   Oral   Take 4 mg by mouth 2 (two) times daily.         Marland Kitchen LORAZEPAM 0.5 MG PO TABS   Oral   Take 0.25 mg by mouth 3 (three) times daily as needed. For agitation         . OVER THE COUNTER MEDICATION   Perianal   Place 1 application around the anus once a week. Mix 1 tablespoon of vinegar to 1 pint water was over perineal area once weekly for maintenance.         Marland Kitchen POTASSIUM CHLORIDE ER 10 MEQ PO TBCR   Oral   Take 30 mEq by mouth daily.           BP 128/57  Pulse 67  Temp 98.7 F (37.1 C) (Oral)  Resp 16  SpO2 99%  Physical Exam  Nursing note and vitals reviewed. Constitutional: She appears well-developed and well-nourished. No distress.  HENT:  Head: Normocephalic.       Minimal contusion to her right scalp.    Eyes: Conjunctivae normal and EOM are normal. Pupils are equal, round, and reactive to light.  Neck: Normal range of motion. Neck supple. No tracheal deviation present.  Cardiovascular: Normal rate, regular rhythm, normal heart sounds and intact distal  pulses.  Exam reveals no gallop and no friction rub.   No murmur heard. Pulmonary/Chest: Effort normal and breath sounds normal. No respiratory distress.  Abdominal: Soft. Bowel sounds are normal. She exhibits no distension and no mass. There is no tenderness. There is no rebound and no guarding.       No pulsatile mass  Musculoskeletal: Normal range of motion. She exhibits edema. She exhibits no tenderness.       Old bruising to her toes to the left foot. Distal pulses palp. No focal bony tenderness. Mild bilateral lower leg/ankle edema, symmetric. No calf swelling or tenderness.   CTLS spine, non tender, aligned, no step off. Normal rom c spine without pain.    Neurological: She is alert.       Alert, content. Confused/dementia, however mental status c/w baseline per family. Moves bil ext purposefully w good strength.  Skin: Skin is warm and dry.  Psychiatric: She has a normal mood and affect. Her behavior is normal.       Confusion, dementia.  Family states it is consistent with baseline.     ED Course  Procedures   DIAGNOSTIC STUDIES: Oxygen Saturation is 99% on room air, normal by my interpretation.    COORDINATION OF CARE:  13:09 Ordered: CBC; Basic metabolic panel; Urinalysis, Routine w reflex microscopic  Results for orders placed during the hospital encounter of 09/10/12  CBC      Component Value Range   WBC 9.3  4.0 - 10.5 K/uL   RBC 4.14  3.87 - 5.11 MIL/uL   Hemoglobin 12.4  12.0 - 15.0 g/dL   HCT 16.1  09.6 - 04.5 %   MCV 91.3  78.0 - 100.0 fL   MCH 30.0  26.0 - 34.0 pg   MCHC 32.8  30.0 - 36.0 g/dL   RDW 40.9  81.1 - 91.4 %   Platelets 139 (*) 150 - 400 K/uL  BASIC METABOLIC PANEL      Component Value Range   Sodium 139  135 - 145 mEq/L   Potassium  3.6  3.5 - 5.1 mEq/L   Chloride 100  96 - 112 mEq/L   CO2 26  19 - 32 mEq/L   Glucose, Bld 92  70 - 99 mg/dL   BUN 16  6 - 23 mg/dL   Creatinine, Ser 1.47  0.50 - 1.10 mg/dL   Calcium 9.5  8.4 - 82.9 mg/dL    GFR calc non Af Amer 84 (*) >90 mL/min   GFR calc Af Amer >90  >90 mL/min  URINALYSIS, ROUTINE W REFLEX MICROSCOPIC      Component Value Range   Color, Urine YELLOW  YELLOW   APPearance CLOUDY (*) CLEAR   Specific Gravity, Urine 1.014  1.005 - 1.030   pH 8.0  5.0 - 8.0   Glucose, UA NEGATIVE  NEGATIVE mg/dL   Hgb urine dipstick NEGATIVE  NEGATIVE   Bilirubin Urine NEGATIVE  NEGATIVE   Ketones, ur 15 (*) NEGATIVE mg/dL   Protein, ur NEGATIVE  NEGATIVE mg/dL   Urobilinogen, UA 1.0  0.0 - 1.0 mg/dL   Nitrite NEGATIVE  NEGATIVE   Leukocytes, UA TRACE (*) NEGATIVE  URINE MICROSCOPIC-ADD ON      Component Value Range   Squamous Epithelial / LPF FEW (*) RARE   WBC, UA 3-6  <3 WBC/hpf   Urine-Other AMORPHOUS URATES/PHOSPHATES     Dg Chest 1 View  08/14/2012  *RADIOLOGY REPORT*  Clinical Data: Chest pain  CHEST - 1 VIEW  Comparison: 04/16/2012  Findings: Chronic interstitial markings.  Mild bibasilar opacities, likely atelectasis / scarring.  No pleural effusion or pneumothorax.  Cardiomediastinal silhouette is within normal limits.  IMPRESSION: No evidence of acute cardiopulmonary disease.   Original Report Authenticated By: Charline Bills, M.D.         MDM  I personally performed the services described in this documentation, which was scribed in my presence. The recorded information has been reviewed and is accurate.  Discussed w family. Labs. Ecg.   Reviewed nursing notes and prior charts for additional history.     Date: 09/10/2012  Rate: 68  Rhythm: normal sinus rhythm  QRS Axis: normal  Intervals: normal  ST/T Wave abnormalities: normal  Conduction Disutrbances:none  Narrative Interpretation:   Old EKG Reviewed: unchanged     Pt remains alert, content, smiling. No pain or discomfort. Ms remains at baseline. nsr on monitor, no dysrhythmia.   ecf chart reviewed, dnr noted on chart, meds reviewed.  Pt appears stable for d/c.      Claudia Roots, MD 09/10/12  1456  Claudia Roots, MD 09/10/12 1459  Claudia Roots, MD 09/10/12 1500

## 2012-09-10 NOTE — ED Notes (Signed)
Pt came by ems from [facility], reports tripping and falling, no loc. Had head pain initially, denies pain at this time. No obv injuries noted.

## 2012-09-10 NOTE — ED Notes (Signed)
Pt given diet coke. 

## 2012-09-24 ENCOUNTER — Emergency Department (HOSPITAL_COMMUNITY)
Admission: EM | Admit: 2012-09-24 | Discharge: 2012-09-24 | Disposition: A | Discharge status: Home / Self Care | Payer: Medicare Other | Attending: Emergency Medicine | Admitting: Emergency Medicine

## 2012-09-24 ENCOUNTER — Encounter (HOSPITAL_COMMUNITY): Payer: Self-pay | Admitting: Emergency Medicine

## 2012-09-24 DIAGNOSIS — Z8739 Personal history of other diseases of the musculoskeletal system and connective tissue: Secondary | ICD-10-CM | POA: Insufficient documentation

## 2012-09-24 DIAGNOSIS — R55 Syncope and collapse: Secondary | ICD-10-CM

## 2012-09-24 DIAGNOSIS — I10 Essential (primary) hypertension: Secondary | ICD-10-CM | POA: Insufficient documentation

## 2012-09-24 DIAGNOSIS — E7800 Pure hypercholesterolemia, unspecified: Secondary | ICD-10-CM | POA: Insufficient documentation

## 2012-09-24 DIAGNOSIS — Z8673 Personal history of transient ischemic attack (TIA), and cerebral infarction without residual deficits: Secondary | ICD-10-CM | POA: Insufficient documentation

## 2012-09-24 DIAGNOSIS — Z8709 Personal history of other diseases of the respiratory system: Secondary | ICD-10-CM | POA: Insufficient documentation

## 2012-09-24 DIAGNOSIS — Z8669 Personal history of other diseases of the nervous system and sense organs: Secondary | ICD-10-CM | POA: Insufficient documentation

## 2012-09-24 DIAGNOSIS — Z7982 Long term (current) use of aspirin: Secondary | ICD-10-CM | POA: Insufficient documentation

## 2012-09-24 DIAGNOSIS — Z79899 Other long term (current) drug therapy: Secondary | ICD-10-CM | POA: Insufficient documentation

## 2012-09-24 LAB — GLUCOSE, CAPILLARY: Glucose-Capillary: 68 mg/dL — ABNORMAL LOW (ref 70–99)

## 2012-09-24 NOTE — ED Notes (Signed)
Pt discharged to nursing home with family. NAD

## 2012-09-24 NOTE — ED Notes (Signed)
Pt presents to ED via EMS after sitting in nursing home unresponsive. Patient became responsive when EMS arrived. NAD.

## 2012-09-24 NOTE — ED Provider Notes (Signed)
History     CSN: [id number]  Arrival date & time 09/24/12  1653   First MD Initiated Contact with Patient 09/24/12 1709      No chief complaint on file.    HPI Patient became unresponsive while sitting in a wheelchair.  Patient's had episodes similar to this in the last few months.  Patient's had workups in the past similar to this.  No definite cause has been found.  Patient refuses to let extensive workup including Holter monitor and further blood work.  Family does not want significant workup done. Past Medical History  Diagnosis Date  . Dementia   . TIA (transient ischemic attack)   . Hypertension   . Mitral valve regurgitation   . Hypercholesteremia   . Osteoarthritis   . Insomnia     Past Surgical History  Procedure Date  . Abdominal hysterectomy   . Breast biopsy     History reviewed. No pertinent family history.  History  Substance Use Topics  . Smoking status: Never Smoker   . Smokeless tobacco: Never Used  . Alcohol Use: No    OB History    Grav Para Term Preterm Abortions TAB SAB Ect Mult Living                  Review of Systems Level V caveat (severe dementia) Allergies  Review of patient's allergies indicates no known allergies.  Home Medications   Current Outpatient Rx  Name  Route  Sig  Dispense  Refill  . AMLODIPINE BESYLATE 5 MG PO TABS   Oral   Take 5 mg by mouth daily.         . ASPIRIN EC 81 MG PO TBEC   Oral   Take 81 mg by mouth daily.         . ATORVASTATIN CALCIUM 10 MG PO TABS   Oral   Take 10 mg by mouth daily.         . CHOLECALCIFEROL 400 UNITS PO TABS   Oral   Take 400 Units by mouth 2 (two) times daily.         Marland Kitchen CRANBERRY 425 MG PO CAPS   Oral   Take 1 capsule by mouth daily.         Marland Kitchen DIVALPROEX SODIUM 125 MG PO CPSP   Oral   Take 250 mg by mouth 2 (two) times daily.         Marland Kitchen GALANTAMINE HYDROBROMIDE 4 MG PO TABS   Oral   Take 4 mg by mouth daily.         Marland Kitchen LORAZEPAM 0.5 MG PO TABS  Oral   Take 0.25 mg by mouth 3 (three) times daily as needed. For agitation         . OVER THE COUNTER MEDICATION   Perianal   Place 1 application around the anus once a week. Mix 1 tablespoon of vinegar to 1 pint water was over perineal area once weekly for maintenance.         Marland Kitchen POTASSIUM CHLORIDE ER 10 MEQ PO TBCR   Oral   Take 30 mEq by mouth daily.         . TRAMADOL HCL 50 MG PO TABS   Oral   Take 25 mg by mouth every 6 (six) hours as needed. For pain         . CEPHALEXIN 500 MG PO CAPS   Oral   Take 500 mg by mouth 3 (three)  times daily. Started 09/09/12         . VITAMIN D 400 UNITS PO CAPS   Oral   Take 400 Units by mouth 2 (two) times daily.         Marland Kitchen DIVALPROEX SODIUM 250 MG PO TBEC   Oral   Take 250 mg by mouth 2 (two) times daily.           BP 139/59  Pulse 69  Temp 97.9 F (36.6 C)  Resp 18  SpO2 100%  Physical Exam  Nursing note and vitals reviewed. Constitutional: She appears well-developed and well-nourished. No distress.  HENT:  Head: Normocephalic and atraumatic.  Eyes: Pupils are equal, round, and reactive to light.  Neck: Normal range of motion.  Cardiovascular: Normal rate and intact distal pulses.   Murmur heard.  Systolic murmur is present with a grade of 1/6  Pulmonary/Chest: No respiratory distress.  Abdominal: Normal appearance. She exhibits no distension. There is no tenderness. There is no rebound.  Musculoskeletal: Normal range of motion. She exhibits edema (1+ bilaterally).  Neurological: She is alert. No cranial nerve deficit. Coordination abnormal. GCS eye subscore is 4. GCS verbal subscore is 5. GCS motor subscore is 6.  Skin: Skin is warm and dry. No rash noted.  Psychiatric: She has a normal mood and affect. Her behavior is normal.    ED Course  Procedures (including critical care time)  Date: 09/24/2012  Rate: 62  Rhythm: normal sinus rhythm  QRS Axis: normal  Intervals: PAC  ST/T Wave abnormalities:  normal  Conduction Disutrbances: none  Narrative Interpretation: Nonspecific EKG     Labs Reviewed  GLUCOSE, CAPILLARY - Abnormal; Notable for the following:    Glucose-Capillary 68 (*)     All other components within normal limits  LAB REPORT - SCANNED   No results found.   1. Syncope       MDM   Patient's family did not want an extensive workup done.  She's had scans and workups for same in the past.  We discussed options and came up with a plan for the nursing home to follow should this happen in the future.       Nelia Shi, MD 09/25/12 [phone]

## 2012-09-24 NOTE — ED Notes (Signed)
Snack given to patient.  

## 2013-02-14 ENCOUNTER — Encounter (HOSPITAL_COMMUNITY): Payer: Self-pay | Admitting: Emergency Medicine

## 2013-02-14 ENCOUNTER — Emergency Department (HOSPITAL_COMMUNITY): Payer: Medicare Other

## 2013-02-14 ENCOUNTER — Emergency Department (HOSPITAL_COMMUNITY)
Admission: EM | Admit: 2013-02-14 | Discharge: 2013-02-14 | Disposition: A | Discharge status: Home / Self Care | Payer: Medicare Other | Attending: Emergency Medicine | Admitting: Emergency Medicine

## 2013-02-14 DIAGNOSIS — I10 Essential (primary) hypertension: Secondary | ICD-10-CM | POA: Insufficient documentation

## 2013-02-14 DIAGNOSIS — E7800 Pure hypercholesterolemia, unspecified: Secondary | ICD-10-CM | POA: Insufficient documentation

## 2013-02-14 DIAGNOSIS — F0390 Unspecified dementia without behavioral disturbance: Secondary | ICD-10-CM | POA: Insufficient documentation

## 2013-02-14 DIAGNOSIS — Z8673 Personal history of transient ischemic attack (TIA), and cerebral infarction without residual deficits: Secondary | ICD-10-CM | POA: Insufficient documentation

## 2013-02-14 DIAGNOSIS — M25519 Pain in unspecified shoulder: Secondary | ICD-10-CM | POA: Insufficient documentation

## 2013-02-14 DIAGNOSIS — M479 Spondylosis, unspecified: Secondary | ICD-10-CM

## 2013-02-14 DIAGNOSIS — R509 Fever, unspecified: Secondary | ICD-10-CM | POA: Insufficient documentation

## 2013-02-14 DIAGNOSIS — Z8679 Personal history of other diseases of the circulatory system: Secondary | ICD-10-CM | POA: Insufficient documentation

## 2013-02-14 DIAGNOSIS — Z7982 Long term (current) use of aspirin: Secondary | ICD-10-CM | POA: Insufficient documentation

## 2013-02-14 DIAGNOSIS — Z79899 Other long term (current) drug therapy: Secondary | ICD-10-CM | POA: Insufficient documentation

## 2013-02-14 DIAGNOSIS — G4700 Insomnia, unspecified: Secondary | ICD-10-CM | POA: Insufficient documentation

## 2013-02-14 LAB — CBC WITH DIFFERENTIAL/PLATELET
Eosinophils Absolute: 0 10*3/uL (ref 0.0–0.7)
Lymphocytes Relative: 17 % (ref 12–46)
Lymphs Abs: 1.9 10*3/uL (ref 0.7–4.0)
MCH: 30.6 pg (ref 26.0–34.0)
Neutrophils Relative %: 70 % (ref 43–77)
Platelets: 148 10*3/uL — ABNORMAL LOW (ref 150–400)
RBC: 4.09 MIL/uL (ref 3.87–5.11)
WBC: 10.7 10*3/uL — ABNORMAL HIGH (ref 4.0–10.5)

## 2013-02-14 LAB — URINALYSIS, ROUTINE W REFLEX MICROSCOPIC
Nitrite: NEGATIVE
Specific Gravity, Urine: 1.018 (ref 1.005–1.030)
Urobilinogen, UA: 0.2 mg/dL (ref 0.0–1.0)

## 2013-02-14 LAB — URINE MICROSCOPIC-ADD ON

## 2013-02-14 LAB — POCT I-STAT, CHEM 8
Creatinine, Ser: 0.5 mg/dL (ref 0.50–1.10)
Hemoglobin: 12.2 g/dL (ref 12.0–15.0)
Potassium: 3.6 mEq/L (ref 3.5–5.1)
Sodium: 137 mEq/L (ref 135–145)

## 2013-02-14 LAB — POCT I-STAT TROPONIN I

## 2013-02-14 MED ORDER — TRAMADOL HCL 50 MG PO TABS: 50.0000 mg | ORAL_TABLET | Freq: Four times a day (QID) | ORAL | Status: DC | PRN

## 2013-02-14 MED ORDER — ACETAMINOPHEN 325 MG PO TABS
650.0000 mg | ORAL_TABLET | Freq: Four times a day (QID) | ORAL | Status: DC | PRN
  Administered 2013-02-14: 650 mg via ORAL
  Filled 2013-02-14: qty 2

## 2013-02-14 NOTE — ED Provider Notes (Signed)
History    CSN: [id number] Arrival date & time 02/14/13  1821 First MD Initiated Contact with Patient 02/14/13 1958      Chief Complaint  Patient presents with  . Shoulder Pain    right  . Neck Pain    right   Level V caveat: Dementia HPI Patient presents to the emergency room with complaints of neck pain. The patient is a resident of [facility]. The staff today noted the patient started complaining of neck pain. When they went to touch her she began complaining of pain in her neck area and shoulder. As far as they know, the patient did not have any falls. She has not had any trouble with fever or chest pain. Past Medical History  Diagnosis Date  . Dementia   . TIA (transient ischemic attack)   . Hypertension   . Mitral valve regurgitation   . Hypercholesteremia   . Osteoarthritis   . Insomnia     Past Surgical History  Procedure Laterality Date  . Abdominal hysterectomy    . Breast biopsy      No family history on file.  History  Substance Use Topics  . Smoking status: Never Smoker   . Smokeless tobacco: Never Used  . Alcohol Use: No    OB History   Grav Para Term Preterm Abortions TAB SAB Ect Mult Living                  Review of Systems  Gastrointestinal: Negative for vomiting and diarrhea.  All other systems reviewed and are negative.    Allergies  Review of patient's allergies indicates no known allergies.  Home Medications   Current Outpatient Rx  Name  Route  Sig  Dispense  Refill  . amLODipine (NORVASC) 5 MG tablet   Oral   Take 5 mg by mouth daily.         Marland Kitchen aspirin EC 81 MG tablet   Oral   Take 81 mg by mouth daily.         Marland Kitchen atorvastatin (LIPITOR) 10 MG tablet   Oral   Take 10 mg by mouth at bedtime.          . cholecalciferol (VITAMIN D) 400 UNITS TABS   Oral   Take 400 Units by mouth 2 (two) times daily.         . Cranberry 425 MG CAPS   Oral   Take 1 capsule by mouth daily.         . divalproex (DEPAKOTE  SPRINKLE) 125 MG capsule   Oral   Take 250 mg by mouth 2 (two) times daily.         Marland Kitchen galantamine (RAZADYNE) 4 MG tablet   Oral   Take 4 mg by mouth daily.         Marland Kitchen LORazepam (ATIVAN) 0.5 MG tablet   Oral   Take 0.25 mg by mouth 3 (three) times daily as needed for anxiety.          . potassium chloride (K-DUR) 10 MEQ tablet   Oral   Take 30 mEq by mouth daily.         Marland Kitchen tobramycin (TOBREX) 0.3 % ophthalmic solution   Both Eyes   Place 2 drops into both eyes 2 (two) times daily. 7 day course of therapy started 02/08/13.         . traMADol (ULTRAM) 50 MG tablet   Oral   Take 25 mg by mouth  every 6 (six) hours as needed. For pain         . traMADol (ULTRAM) 50 MG tablet   Oral   Take 1 tablet (50 mg total) by mouth every 6 (six) hours as needed for pain.   15 tablet   0     BP 135/59  Pulse 84  Temp(Src) 100.3 F (37.9 C) (Oral)  Resp 17  SpO2 94%  Physical Exam  Nursing note and vitals reviewed. Constitutional: No distress.  Frail, elderly, confused  HENT:  Head: Normocephalic and atraumatic.  Right Ear: External ear normal.  Left Ear: External ear normal.  Eyes: Conjunctivae are normal. Right eye exhibits no discharge. Left eye exhibits no discharge. No scleral icterus.  Neck: Neck supple. No tracheal deviation present.  Mild tenderness palpation cervical spine, no edema, no step-off, no erythema, full range of motion of the neck  Cardiovascular: Normal rate, regular rhythm and intact distal pulses.   Pulmonary/Chest: Effort normal and breath sounds normal. No stridor. No respiratory distress. She has no wheezes. She has no rales.  Abdominal: Soft. Bowel sounds are normal. She exhibits no distension. There is no tenderness. There is no rebound and no guarding.  Musculoskeletal: She exhibits no edema and no tenderness.  Able to move both arms without pain or difficulty  Neurological: She is alert. She has normal strength. No sensory deficit. Cranial  nerve deficit:  no gross defecits noted. She exhibits normal muscle tone. She displays no seizure activity. Coordination normal.  Skin: Skin is warm and dry. No rash noted.  Psychiatric: She has a normal mood and affect.    ED Course  Procedures (including critical care time)  Labs Reviewed  URINALYSIS, ROUTINE W REFLEX MICROSCOPIC - Abnormal; Notable for the following:    APPearance CLOUDY (*)    Ketones, ur 15 (*)    Leukocytes, UA SMALL (*)    All other components within normal limits  CBC WITH DIFFERENTIAL - Abnormal; Notable for the following:    WBC 10.7 (*)    Platelets 148 (*)    Monocytes Relative 13 (*)    Monocytes Absolute 1.4 (*)    All other components within normal limits  POCT I-STAT, CHEM 8 - Abnormal; Notable for the following:    Glucose, Bld 115 (*)    Calcium, Ion 1.11 (*)    All other components within normal limits  URINE CULTURE  URINE MICROSCOPIC-ADD ON  POCT I-STAT TROPONIN I   Dg Chest 2 View  02/14/2013   *RADIOLOGY REPORT*  Clinical Data: Neck pain and chest pain.  CHEST - 2 VIEW  Comparison: 08/14/2012.  Findings: The heart is mildly enlarged but stable.  There is tortuosity and calcification of the thoracic aorta.  There are chronic emphysematous and bronchitic type lung changes with pulmonary scarring but no definite acute overlying pulmonary process.  No pleural effusion.  The bony thorax is intact.  IMPRESSION: Chronic lung changes without definite acute overlying pulmonary process.   Original Report Authenticated By: Rudie Meyer, M.D.   Dg Cervical Spine Complete  02/14/2013   *RADIOLOGY REPORT*  Clinical Data: Neck pain.  CERVICAL SPINE - COMPLETE 4+ VIEW  Comparison: None  Findings: Moderate degenerative cervical spondylosis with multilevel disc disease and facet disease.  Degenerative anterior subluxation of C5 is noted.  The facets are normally aligned.  No acute fracture or abnormal prevertebral soft tissue swelling. Multilevel bony foraminal  narrowing due to uncinate spurring and facet disease.  The C1-2 articulations  are grossly maintained.  The lung apices are clear.  IMPRESSION:  1.  Degenerative cervical spondylosis with multilevel disc disease and facet disease. 2.  Probable degenerative anterior subluxation of C5. 3.  No acute bony findings.   Original Report Authenticated By: Rudie Meyer, M.D.    1. Degenerative arthritis of spine   2. Fever     MDM  I suspect the patient's neck pain is related to arthritis. She is relatively asymptomatic here in the emergency department. Patient did have a fever up to 101.5. She has no other focal symptoms however. She definitely does not have any symptoms to suggest meningismus and meningitis. A urine culture was sent off for further analysis. Patient is stable for discharge. I have discussed the findings with the patient and her family.       Celene Kras, MD 02/14/13 [phone]

## 2013-02-14 NOTE — ED Notes (Signed)
Per EMS: from [facility] staff states that pt did not fall but when touched c/o right neck and shoulder pain. Pt has dementia but at baseline but states she fell. No injuries noted.

## 2013-02-14 NOTE — ED Notes (Signed)
[id number]<AV> Expected date:<BR> Expected time:<BR> Means of arrival:<BR> Comments:<BR> EMS Fall/shoulder pain

## 2013-02-15 LAB — URINE CULTURE
Colony Count: NO GROWTH
Culture: NO GROWTH

## 2013-07-10 ENCOUNTER — Emergency Department (HOSPITAL_COMMUNITY): Payer: Medicare Other

## 2013-07-10 ENCOUNTER — Observation Stay (HOSPITAL_COMMUNITY)
Admission: EM | Admit: 2013-07-10 | Discharge: 2013-07-12 | Disposition: A | Discharge status: Nursing Home (Includes ICF) | Payer: Medicare Other | Attending: Internal Medicine | Admitting: Internal Medicine

## 2013-07-10 ENCOUNTER — Encounter (HOSPITAL_COMMUNITY): Payer: Self-pay | Admitting: *Deleted

## 2013-07-10 DIAGNOSIS — I10 Essential (primary) hypertension: Secondary | ICD-10-CM

## 2013-07-10 DIAGNOSIS — E162 Hypoglycemia, unspecified: Secondary | ICD-10-CM

## 2013-07-10 DIAGNOSIS — R55 Syncope and collapse: Secondary | ICD-10-CM | POA: Diagnosis present

## 2013-07-10 DIAGNOSIS — G459 Transient cerebral ischemic attack, unspecified: Secondary | ICD-10-CM

## 2013-07-10 DIAGNOSIS — Z79899 Other long term (current) drug therapy: Secondary | ICD-10-CM | POA: Insufficient documentation

## 2013-07-10 DIAGNOSIS — R4182 Altered mental status, unspecified: Secondary | ICD-10-CM

## 2013-07-10 DIAGNOSIS — F0390 Unspecified dementia without behavioral disturbance: Secondary | ICD-10-CM

## 2013-07-10 DIAGNOSIS — E785 Hyperlipidemia, unspecified: Secondary | ICD-10-CM

## 2013-07-10 DIAGNOSIS — I959 Hypotension, unspecified: Secondary | ICD-10-CM

## 2013-07-10 DIAGNOSIS — E871 Hypo-osmolality and hyponatremia: Secondary | ICD-10-CM

## 2013-07-10 LAB — CBC WITH DIFFERENTIAL/PLATELET
Basophils Absolute: 0 10*3/uL (ref 0.0–0.1)
Basophils Relative: 0 % (ref 0–1)
Eosinophils Absolute: 0 10*3/uL (ref 0.0–0.7)
Eosinophils Relative: 0 % (ref 0–5)
HCT: 35.3 % — ABNORMAL LOW (ref 36.0–46.0)
Hemoglobin: 11.9 g/dL — ABNORMAL LOW (ref 12.0–15.0)
Lymphocytes Relative: 13 % (ref 12–46)
Lymphs Abs: 1.4 10*3/uL (ref 0.7–4.0)
MCH: 30.9 pg (ref 26.0–34.0)
MCHC: 33.7 g/dL (ref 30.0–36.0)
MCV: 91.7 fL (ref 78.0–100.0)
Monocytes Absolute: 0.8 10*3/uL (ref 0.1–1.0)
Monocytes Relative: 7 % (ref 3–12)
Neutro Abs: 8.8 10*3/uL — ABNORMAL HIGH (ref 1.7–7.7)
Neutrophils Relative %: 80 % — ABNORMAL HIGH (ref 43–77)
Platelets: 176 10*3/uL (ref 150–400)
RBC: 3.85 MIL/uL — ABNORMAL LOW (ref 3.87–5.11)
RDW: 13.9 % (ref 11.5–15.5)
WBC: 11.1 10*3/uL — ABNORMAL HIGH (ref 4.0–10.5)

## 2013-07-10 LAB — GLUCOSE, CAPILLARY
Glucose-Capillary: 17 mg/dL — CL (ref 70–99)
Glucose-Capillary: 129 mg/dL — ABNORMAL HIGH (ref 70–99)
Glucose-Capillary: 113 mg/dL — ABNORMAL HIGH (ref 70–99)
Glucose-Capillary: 94 mg/dL (ref 70–99)
Glucose-Capillary: 98 mg/dL (ref 70–99)
Glucose-Capillary: 67 mg/dL — ABNORMAL LOW (ref 70–99)

## 2013-07-10 LAB — CBC
HCT: 38.8 % (ref 36.0–46.0)
Hemoglobin: 13.3 g/dL (ref 12.0–15.0)
MCHC: 34.3 g/dL (ref 30.0–36.0)
MCV: 91.1 fL (ref 78.0–100.0)
RBC: 4.26 MIL/uL (ref 3.87–5.11)
WBC: 8.3 10*3/uL (ref 4.0–10.5)

## 2013-07-10 LAB — BASIC METABOLIC PANEL
BUN: 10 mg/dL (ref 6–23)
CO2: 27 mEq/L (ref 19–32)
Calcium: 8.8 mg/dL (ref 8.4–10.5)
Chloride: 99 mEq/L (ref 96–112)
Creatinine, Ser: 0.49 mg/dL — ABNORMAL LOW (ref 0.50–1.10)
GFR calc Af Amer: >90 mL/min (ref >90)
GFR calc non Af Amer: 85 mL/min — ABNORMAL LOW (ref >90)
Glucose, Bld: 83 mg/dL (ref 70–99)
Potassium: 3.5 mEq/L (ref 3.5–5.1)
Sodium: 129 mEq/L — ABNORMAL LOW (ref 135–145)

## 2013-07-10 LAB — URINALYSIS, ROUTINE W REFLEX MICROSCOPIC
Bilirubin Urine: NEGATIVE
Glucose, UA: NEGATIVE mg/dL
Hgb urine dipstick: NEGATIVE
Ketones, ur: NEGATIVE mg/dL
Leukocytes, UA: NEGATIVE
Nitrite: NEGATIVE
Protein, ur: NEGATIVE mg/dL
Specific Gravity, Urine: 1.012 (ref 1.005–1.030)
Urobilinogen, UA: 0.2 mg/dL (ref 0.0–1.0)
pH: 7.5 (ref 5.0–8.0)

## 2013-07-10 LAB — HEMOGLOBIN A1C: Mean Plasma Glucose: 100 mg/dL (ref <117)

## 2013-07-10 LAB — CREATININE, SERUM: GFR calc Af Amer: >90 mL/min (ref >90)

## 2013-07-10 MED ORDER — DEXTROSE 5 % IV SOLN
Freq: Once | INTRAVENOUS | Status: AC
  Administered 2013-07-10: 15:00:00 via INTRAVENOUS

## 2013-07-10 MED ORDER — DEXTROSE 10 % IV SOLN
INTRAVENOUS | Status: DC
  Administered 2013-07-10: 18:00:00 via INTRAVENOUS
  Filled 2013-07-10 (×4): qty 1000

## 2013-07-10 MED ORDER — DEXTROSE 50 % IV SOLN
50.0000 mL | Freq: Once | INTRAVENOUS | Status: AC | PRN
  Administered 2013-07-10: 25 mL via INTRAVENOUS

## 2013-07-10 MED ORDER — POTASSIUM CHLORIDE ER 10 MEQ PO TBCR
30.0000 meq | EXTENDED_RELEASE_TABLET | Freq: Every day | ORAL | Status: DC
  Administered 2013-07-11 – 2013-07-12 (×2): 30 meq via ORAL
  Filled 2013-07-10 (×2): qty 3

## 2013-07-10 MED ORDER — ALUM & MAG HYDROXIDE-SIMETH 200-200-20 MG/5ML PO SUSP
30.0000 mL | Freq: Four times a day (QID) | ORAL | Status: DC | PRN
  Filled 2013-07-10: qty 30

## 2013-07-10 MED ORDER — GLUCOSE 40 % PO GEL
ORAL | Status: AC
  Filled 2013-07-10: qty 1

## 2013-07-10 MED ORDER — ACETAMINOPHEN 325 MG PO TABS: 650.0000 mg | ORAL_TABLET | Freq: Four times a day (QID) | ORAL | Status: DC | PRN

## 2013-07-10 MED ORDER — ONDANSETRON HCL 4 MG/2ML IJ SOLN: 4.0000 mg | Freq: Four times a day (QID) | INTRAMUSCULAR | Status: DC | PRN

## 2013-07-10 MED ORDER — LORAZEPAM 0.5 MG PO TABS
0.2500 mg | ORAL_TABLET | Freq: Three times a day (TID) | ORAL | Status: DC | PRN
  Administered 2013-07-11: 21:00:00 via ORAL
  Filled 2013-07-10: qty 1

## 2013-07-10 MED ORDER — GLUCOSE 40 % PO GEL
1.0000 | ORAL | Status: DC | PRN
  Administered 2013-07-10: 37.5 g via ORAL

## 2013-07-10 MED ORDER — ACETAMINOPHEN 650 MG RE SUPP: 650.0000 mg | Freq: Four times a day (QID) | RECTAL | Status: DC | PRN

## 2013-07-10 MED ORDER — ENOXAPARIN SODIUM 40 MG/0.4ML ~~LOC~~ SOLN
40.0000 mg | SUBCUTANEOUS | Status: DC
  Administered 2013-07-10 – 2013-07-11 (×2): 40 mg via SUBCUTANEOUS
  Filled 2013-07-10 (×3): qty 0.4

## 2013-07-10 MED ORDER — CRANBERRY 425 MG PO CAPS: 1.0000 | ORAL_CAPSULE | Freq: Every day | ORAL | Status: DC

## 2013-07-10 MED ORDER — DIVALPROEX SODIUM 125 MG PO CPSP
250.0000 mg | ORAL_CAPSULE | Freq: Two times a day (BID) | ORAL | Status: DC
  Administered 2013-07-10 – 2013-07-12 (×4): 250 mg via ORAL
  Filled 2013-07-10 (×5): qty 2

## 2013-07-10 MED ORDER — AMLODIPINE BESYLATE 5 MG PO TABS
5.0000 mg | ORAL_TABLET | Freq: Every day | ORAL | Status: DC
  Administered 2013-07-11 – 2013-07-12 (×2): 5 mg via ORAL
  Filled 2013-07-10 (×2): qty 1

## 2013-07-10 MED ORDER — TRAMADOL HCL 50 MG PO TABS: 50.0000 mg | ORAL_TABLET | Freq: Four times a day (QID) | ORAL | Status: DC | PRN

## 2013-07-10 MED ORDER — DEXTROSE 50 % IV SOLN: 25.0000 mL | Freq: Once | INTRAVENOUS | Status: AC | PRN

## 2013-07-10 MED ORDER — ATORVASTATIN CALCIUM 10 MG PO TABS
10.0000 mg | ORAL_TABLET | Freq: Every day | ORAL | Status: DC
  Administered 2013-07-10 – 2013-07-11 (×2): 10 mg via ORAL
  Filled 2013-07-10 (×3): qty 1

## 2013-07-10 MED ORDER — DEXTROSE 10 % IV SOLN
INTRAVENOUS | Status: DC
  Filled 2013-07-10 (×2): qty 1000

## 2013-07-10 MED ORDER — ONDANSETRON HCL 4 MG PO TABS: 4.0000 mg | ORAL_TABLET | Freq: Four times a day (QID) | ORAL | Status: DC | PRN

## 2013-07-10 MED ORDER — GALANTAMINE HYDROBROMIDE 4 MG PO TABS
4.0000 mg | ORAL_TABLET | Freq: Every day | ORAL | Status: DC
  Administered 2013-07-11 – 2013-07-12 (×2): 4 mg via ORAL
  Filled 2013-07-10 (×2): qty 1

## 2013-07-10 MED ORDER — DEXTROSE 50 % IV SOLN: 50.0000 mL | Freq: Once | INTRAVENOUS | Status: AC

## 2013-07-10 MED ORDER — DEXTROSE 50 % IV SOLN
INTRAVENOUS | Status: AC
  Administered 2013-07-10: 25 mL via INTRAVENOUS
  Filled 2013-07-10: qty 50

## 2013-07-10 MED ORDER — ASPIRIN EC 81 MG PO TBEC
81.0000 mg | DELAYED_RELEASE_TABLET | Freq: Every day | ORAL | Status: DC
  Administered 2013-07-11 – 2013-07-12 (×2): 81 mg via ORAL
  Filled 2013-07-10 (×2): qty 1

## 2013-07-10 MED ORDER — DEXTROSE 50 % IV SOLN
INTRAVENOUS | Status: AC
  Administered 2013-07-10: 50 mL via INTRAVENOUS
  Filled 2013-07-10: qty 50

## 2013-07-10 MED ORDER — CHOLECALCIFEROL 10 MCG (400 UNIT) PO TABS
400.0000 [IU] | ORAL_TABLET | Freq: Two times a day (BID) | ORAL | Status: DC
  Administered 2013-07-11 – 2013-07-12 (×3): 400 [IU] via ORAL
  Filled 2013-07-10 (×4): qty 1

## 2013-07-10 NOTE — H&P (Signed)
PATIENT DETAILS Name: Claudia Joyce Age: 79 y.o. Sex: female Date of Birth: Nov 26, 1923 Admit Date: 07/10/2013 [id number] M, MD   CHIEF COMPLAINT:  Syncope  HPI: Claudia Joyce is a 79 y.o. female with a Past Medical History of advanced dementia, hypertension who presents today with the above noted complaint. Patient is a resident of a local skilled nursing facility, she sustained a syncopal event today, and EMS was called, CBG at the scene was 21, she was given D50 and brought to the emergency room. A repeat CBG in the emergency room was 17. I was subsequently asked to admit this patient for further evaluation and treatment. Interestingly, patient does not have a history of diabetes, nor she is on any offending medications. Her daughter is at bedside, apparently yesterday (retrospectively) patient did have some excessive diaphoresis, and episodes of weakness and more altered behavior than usual baseline. Patient was given 1 amp of, and D5 infusion, she is now being admitted for further treatment During my evaluation, patient is very pleasantly confused, is awake, answers some questions appropriately and follows some commands with a lot of encouragement by her daughter.  ALLERGIES:  No Known Allergies  PAST MEDICAL HISTORY: Past Medical History  Diagnosis Date  . Dementia   . TIA (transient ischemic attack)   . Hypertension   . Mitral valve regurgitation   . Hypercholesteremia   . Osteoarthritis   . Insomnia     PAST SURGICAL HISTORY: Past Surgical History  Procedure Laterality Date  . Abdominal hysterectomy    . Breast biopsy      MEDICATIONS AT HOME: Prior to Admission medications   Medication Sig Start Date End Date Taking? Authorizing Provider  amLODipine (NORVASC) 5 MG tablet Take 5 mg by mouth daily.   Yes Historical Provider, MD  aspirin EC 81 MG tablet Take 81 mg by mouth daily.   Yes Historical Provider, MD  atorvastatin (LIPITOR) 10 MG tablet Take 10 mg by  mouth at bedtime.   Yes Historical Provider, MD  cholecalciferol (VITAMIN D) 400 UNITS TABS Take 400 Units by mouth 2 (two) times daily.   Yes Historical Provider, MD  Cranberry 425 MG CAPS Take 1 capsule by mouth daily.   Yes Historical Provider, MD  divalproex (DEPAKOTE SPRINKLE) 125 MG capsule Take 250 mg by mouth 2 (two) times daily.   Yes Historical Provider, MD  galantamine (RAZADYNE) 4 MG tablet Take 4 mg by mouth daily.   Yes Historical Provider, MD  LORazepam (ATIVAN) 0.5 MG tablet Take 0.25 mg by mouth 3 (three) times daily as needed for anxiety.    Yes Historical Provider, MD  potassium chloride (K-DUR) 10 MEQ tablet Take 30 mEq by mouth daily.   Yes Historical Provider, MD  traMADol (ULTRAM) 50 MG tablet Take 50 mg by mouth every 6 (six) hours as needed. For pain   Yes Historical Provider, MD    FAMILY HISTORY: No family history on file.  SOCIAL HISTORY:  reports that she has never smoked. She has never used smokeless tobacco. She reports that she does not drink alcohol or use illicit drugs.  REVIEW OF SYSTEMS:  Constitutional:   No  weight loss,   Fevers, chills, fatigue.  HEENT:    No headaches, Difficulty swallowing,Tooth/dental problems,Sore throat,  No sneezing, itching, ear ache, nasal congestion, post nasal drip,   Cardio-vascular: No chest pain,  Orthopnea, PND, swelling in lower extremities, anasarca,dizziness, palpitations  GI:  No heartburn, indigestion, abdominal pain, nausea, vomiting, diarrhea, change in bowel  habits, loss of appetite  Resp: No shortness of breath with exertion or at rest.  No excess mucus, no productive cough, No non-productive cough,  No coughing up of blood.No change in color of mucus.No wheezing.No chest wall deformity  Skin:  no rash or lesions.  GU:  no dysuria, change in color of urine, no urgency or frequency.  No flank pain.  Musculoskeletal: No joint pain or swelling.  No decreased range of motion.  No back  pain.  Psych: No change in mood or affect. No depression or anxiety.  No memory loss.   PHYSICAL EXAM: Blood pressure 115/62, pulse 61, resp. rate 17, SpO2 99.00%.  General appearance :Awake, alert but pleasantly confused, not in any distress. Speech Clear. Not toxic Looking HEENT: Atraumatic and Normocephalic, pupils equally reactive to light and accomodation Neck: supple, no JVD. No cervical lymphadenopathy.  Chest:Good air entry bilaterally, no added sounds  CVS: S1 S2 regular, no murmurs.  Abdomen: Bowel sounds present, Non tender and not distended with no gaurding, rigidity or rebound. Extremities: B/L Lower Ext shows no edema, both legs are warm to touch Neurology: Awake alert, but pleasantly confused, CN II-XII intact, Non focal Skin:No Rash Wounds:N/A  LABS ON ADMISSION:   Recent Labs  07/10/13 1430  NA 129*  K 3.5  CL 99  CO2 27  GLUCOSE 83  BUN 10  CREATININE 0.49*  CALCIUM 8.8   No results found for this basename: AST, ALT, ALKPHOS, BILITOT, PROT, ALBUMIN,  in the last 72 hours No results found for this basename: LIPASE, AMYLASE,  in the last 72 hours  Recent Labs  07/10/13 1430  WBC 11.1*  NEUTROABS 8.8*  HGB 11.9*  HCT 35.3*  MCV 91.7  PLT 176   No results found for this basename: CKTOTAL, CKMB, CKMBINDEX, TROPONINI,  in the last 72 hours No results found for this basename: DDIMER,  in the last 72 hours No components found with this basename: POCBNP,    RADIOLOGIC STUDIES ON ADMISSION: Ct Head Wo Contrast  07/10/2013   CLINICAL DATA:  Syncope.  EXAM: CT HEAD WITHOUT CONTRAST  TECHNIQUE: Contiguous axial images were obtained from the base of the skull through the vertex without intravenous contrast.  COMPARISON:  04/16/2012  FINDINGS: No mass lesion. No midline shift. No acute hemorrhage or hematoma. No extra-axial fluid collections. No evidence of acute infarction. There is severe temporal lobe atrophy with diffuse cerebral cortical and cerebellar  atrophy. Old lacunar infarcts in the pons. No osseous abnormality.  IMPRESSION: 1. No acute intracranial abnormality. 2. Severe temporal lobe atrophy, unchanged. 3.  Old lacunar infarcts in the pons but new since 04/16/2012.   Electronically Signed   By: Geanie Cooley M.D.   On: 07/10/2013 14:24   Ct Cervical Spine Wo Contrast  07/10/2013   CLINICAL DATA:  Syncope.  EXAM: CT CERVICAL SPINE WITHOUT CONTRAST  TECHNIQUE: Multidetector CT imaging of the cervical spine was performed without intravenous contrast. Multiplanar CT image reconstructions were also generated.  COMPARISON:  Cervical radiographs dated 02/14/2013  FINDINGS: There is no acute fracture or subluxation. There is chronic subluxation of C5 on C6 of 2 mm and chronic subluxation of C7 on T1 of 2.5 mm. This is secondary to severe right facet arthritis at C5-6 and severe left facet arthritis at C7-T1 respectively. No prevertebral soft tissue swelling. There is degenerative disc and joint disease throughout the cervical spine.  IMPRESSION: No acute abnormalities of the cervical spine.   Electronically Signed  By: Geanie Cooley M.D.   On: 07/10/2013 14:30    ASSESSMENT AND PLAN: Present on Admission:  . Syncope - Secondary to hypoglycemia  - CT head and C-spine negative, no need for further workup at this time apart from determining etiology for hypoglycemia.   . Hypoglycemia - Suspect accidental ingestion of oral hypoglycemic agents, although her daughter does give history of very poor oral intake for the past few days. As noted above, she does not have a history of diabetes, nor is she on on any oral hypoglycemic agents or insulin - Will check sulfonylurea panel, proinsulin, C-peptide  - Place on D. 10 infusion, check CBGs every hour until CBGs stabilize and then changed to every 2 hours - Monitor and follow clinical course   . Hypertension - Continue with amlodipine   . Hyponatremia - Mild, hydrate and recheck in a.m., if persistently  low then commence further workup   . Dementia - Chronic issue, currently at baseline - Continue preadmission medication  Further plan will depend as patient's clinical course evolves and further radiologic and laboratory data become available. Patient will be monitored closely.   DVT Prophylaxis: Prophylactic Lovenox   Code Status: DNR- reconfirmed with daughter, outer facility DO NOT RESUSCITATE also present with SNF paperwork  Total time spent for admission equals 45 minutes.  [facility] Pager [phone]  If 7PM-7AM, please contact night-coverage [email or web address] Password [email or web address] 07/10/2013, 4:10 PM

## 2013-07-10 NOTE — ED Notes (Signed)
Pt from [facility], EMS initial call for syncopal episode. EMS initial CBG of 21. Pt received D-10, enroute last cbg 157.

## 2013-07-10 NOTE — Progress Notes (Signed)
PHARMACIST - PHYSICIAN ORDER COMMUNICATION  CONCERNING: P&T Medication Policy on Herbal Medications  DESCRIPTION:  This patient's order for:  Cranberry Caps  has been noted.  This product(s) is classified as an "herbal" or natural product. Due to a lack of definitive safety studies or FDA approval, nonstandard manufacturing practices, plus the potential risk of unknown drug-drug interactions while on inpatient medications, the Pharmacy and Therapeutics Committee does not permit the use of "herbal" or natural products of this type within [facility].   ACTION TAKEN: The pharmacy department is unable to verify this order at this time and your patient has been informed of this safety policy. Please reevaluate patient's clinical condition at discharge and address if the herbal or natural product(s) should be resumed at that time.  Celedonio Miyamoto, PharmD, BCPS Clinical Pharmacist Pager [phone]

## 2013-07-10 NOTE — Progress Notes (Signed)
Hypoglycemic Event  CBG: 65  Treatment: 1 tube instant glucose  Symptoms: None  Follow-up CBG: Time: 1843 CBG Result: 94  Possible Reasons for Event: Unknown  Comments/MD notified:     Shandell Giovanni R  Remember to initiate Hypoglycemia Order Set & complete

## 2013-07-10 NOTE — ED Provider Notes (Signed)
CSN: [id number]     Arrival date & time 07/10/13  1237 History   First MD Initiated Contact with Patient 07/10/13 1241     Chief Complaint  Patient presents with  . Hypoglycemia   (Consider location/radiation/quality/duration/timing/severity/associated sxs/prior Treatment) HPI  79 year old female brought in for evaluation of after she had a syncopal event. She is coming from a nursing home. She has baseline dementia and is unable to provide any useful history. On EMS arrival patient had a blood sugar in the 20s. She was given D10 with improvement of her mental status. Her daughter reports that she is currently at her baseline in the emergency room. She reports yesterday the patient did not seem to be normal self and had an episode of back arching which is not typical behavior. At baseline she can perform some ADLs such as using bathroom with reminders but that most thinks require assistance. No history of diabetes. No medications on her list which should cause hypoglycemia.    Past Medical History  Diagnosis Date  . Dementia   . TIA (transient ischemic attack)   . Hypertension   . Mitral valve regurgitation   . Hypercholesteremia   . Osteoarthritis   . Insomnia    Past Surgical History  Procedure Laterality Date  . Abdominal hysterectomy    . Breast biopsy     No family history on file. History  Substance Use Topics  . Smoking status: Never Smoker   . Smokeless tobacco: Never Used  . Alcohol Use: No   OB History   Grav Para Term Preterm Abortions TAB SAB Ect Mult Living                 Review of Systems  Level 5 caveat applies because pt is severely demented and unreliable historian.   Allergies  Review of patient's allergies indicates no known allergies.  Home Medications   Current Outpatient Rx  Name  Route  Sig  Dispense  Refill  . EXPIRED: amLODipine (NORVASC) 5 MG tablet   Oral   Take 5 mg by mouth daily.         Marland Kitchen aspirin EC 81 MG tablet   Oral   Take  81 mg by mouth daily.         Marland Kitchen EXPIRED: atorvastatin (LIPITOR) 10 MG tablet   Oral   Take 10 mg by mouth at bedtime.          . cholecalciferol (VITAMIN D) 400 UNITS TABS   Oral   Take 400 Units by mouth 2 (two) times daily.         . Cranberry 425 MG CAPS   Oral   Take 1 capsule by mouth daily.         . divalproex (DEPAKOTE SPRINKLE) 125 MG capsule   Oral   Take 250 mg by mouth 2 (two) times daily.         Marland Kitchen galantamine (RAZADYNE) 4 MG tablet   Oral   Take 4 mg by mouth daily.         Marland Kitchen LORazepam (ATIVAN) 0.5 MG tablet   Oral   Take 0.25 mg by mouth 3 (three) times daily as needed for anxiety.          . potassium chloride (K-DUR) 10 MEQ tablet   Oral   Take 30 mEq by mouth daily.         Marland Kitchen tobramycin (TOBREX) 0.3 % ophthalmic solution   Both Eyes   Place  2 drops into both eyes 2 (two) times daily. 7 day course of therapy started 02/08/13.         . traMADol (ULTRAM) 50 MG tablet   Oral   Take 25 mg by mouth every 6 (six) hours as needed. For pain         . traMADol (ULTRAM) 50 MG tablet   Oral   Take 1 tablet (50 mg total) by mouth every 6 (six) hours as needed for pain.   15 tablet   0    There were no vitals taken for this visit. Physical Exam  Nursing note and vitals reviewed. Constitutional: She appears well-developed and well-nourished. No distress.  HENT:  Head: Normocephalic and atraumatic.  Eyes: Conjunctivae are normal. Right eye exhibits no discharge. Left eye exhibits no discharge.  Neck: Neck supple.  Cardiovascular: Normal rate, regular rhythm and normal heart sounds.  Exam reveals no gallop and no friction rub.   No murmur heard. Pulmonary/Chest: Effort normal and breath sounds normal. No respiratory distress.  Abdominal: Soft. She exhibits no distension. There is no tenderness.  Musculoskeletal: She exhibits no edema and no tenderness.  Neurological: She is alert. She exhibits normal muscle tone.  Confused. Disoriented  to place time. Follows simple commands such as squeeze hand, move toes, etc. No focal motor deficit noted. Difficulty with some commands to assess CN. No droop. EOM appear intact.   Skin: Skin is warm and dry.  Psychiatric:  Confused. Will grab examiner's hands occasionally when trying to assess her, but non combative when not physically touched.     ED Course  Procedures (including critical care time)  CRITICAL CARE Performed by: Raeford Razor  Total critical care time: 35 minutes  Critical care time was exclusive of separately billable procedures and treating other patients. Critical care was necessary to treat or prevent imminent or life-threatening deterioration. Critical care was time spent personally by me on the following activities: development of treatment plan with patient and/or surrogate as well as nursing, discussions with consultants, evaluation of patient's response to treatment, examination of patient, obtaining history from patient or surrogate, ordering and performing treatments and interventions, ordering and review of laboratory studies, ordering and review of radiographic studies, pulse oximetry and re-evaluation of patient's condition.  Labs Review Labs Reviewed - No data to display Imaging Review Ct Head Wo Contrast  07/10/2013   CLINICAL DATA:  Syncope.  EXAM: CT HEAD WITHOUT CONTRAST  TECHNIQUE: Contiguous axial images were obtained from the base of the skull through the vertex without intravenous contrast.  COMPARISON:  04/16/2012  FINDINGS: No mass lesion. No midline shift. No acute hemorrhage or hematoma. No extra-axial fluid collections. No evidence of acute infarction. There is severe temporal lobe atrophy with diffuse cerebral cortical and cerebellar atrophy. Old lacunar infarcts in the pons. No osseous abnormality.  IMPRESSION: 1. No acute intracranial abnormality. 2. Severe temporal lobe atrophy, unchanged. 3.  Old lacunar infarcts in the pons but new since  04/16/2012.   Electronically Signed   By: Geanie Cooley M.D.   On: 07/10/2013 14:24   Ct Cervical Spine Wo Contrast  07/10/2013   CLINICAL DATA:  Syncope.  EXAM: CT CERVICAL SPINE WITHOUT CONTRAST  TECHNIQUE: Multidetector CT imaging of the cervical spine was performed without intravenous contrast. Multiplanar CT image reconstructions were also generated.  COMPARISON:  Cervical radiographs dated 02/14/2013  FINDINGS: There is no acute fracture or subluxation. There is chronic subluxation of C5 on C6 of 2 mm and chronic  subluxation of C7 on T1 of 2.5 mm. This is secondary to severe right facet arthritis at C5-6 and severe left facet arthritis at C7-T1 respectively. No prevertebral soft tissue swelling. There is degenerative disc and joint disease throughout the cervical spine.  IMPRESSION: No acute abnormalities of the cervical spine.   Electronically Signed   By: Geanie Cooley M.D.   On: 07/10/2013 14:30    EKG Interpretation   None       MDM   1. Hypoglycemia   2. Hyponatremia   3. Syncope     3:14 PM Pt becoming more confused and combative. Repeat CBG 17. D50 given and d5 started. MS improved. Will admit.   79 year old female with syncope and altered mental status. More likely secondary to hypoglycemia. Patient with several evaluations for the past year for syncope w/o clear etiology. I didn't see any episodes of significant hypoglycemia though. No obvious offending medications per review of her medication list. Possibly given medication erroneously in facility? Patient with repeat episode of hypoglycemia in the emergency room. She was started on fluids with dextrose. Will admit for further evaluation to make sure that glucose stabilizes.  Raeford Razor, MD 07/11/13 [phone]

## 2013-07-10 NOTE — Progress Notes (Addendum)
Hypoglycemic Event  CBG:67 Treatmen 1/2 amp d50  Symptoms:none Follow-up CBG: TimePossible Reasons for Event: unknown Comments will continue per order  Jamesetta Orleans, Azell Bill P  Remember to initiate Hypoglycemia Order Set & complete

## 2013-07-10 NOTE — Progress Notes (Signed)
Pt admitted to the unit at 1640. Pt mental status is alert to self only. Pt oriented to room, staff, and call bell. Skin is intact. Full assessment charted in [facility]. Call bell within reach. Visitor guidelines reviewed w/ pt and/or family.  Peri Maris, MBA, BS, RN

## 2013-07-10 NOTE — ED Notes (Addendum)
CBG 17. Nurse notified immediately at 1507. Glucometer not registering.

## 2013-07-11 DIAGNOSIS — R4182 Altered mental status, unspecified: Secondary | ICD-10-CM

## 2013-07-11 DIAGNOSIS — I10 Essential (primary) hypertension: Secondary | ICD-10-CM

## 2013-07-11 DIAGNOSIS — E162 Hypoglycemia, unspecified: Principal | ICD-10-CM

## 2013-07-11 DIAGNOSIS — E871 Hypo-osmolality and hyponatremia: Secondary | ICD-10-CM

## 2013-07-11 DIAGNOSIS — F0390 Unspecified dementia without behavioral disturbance: Secondary | ICD-10-CM

## 2013-07-11 DIAGNOSIS — R55 Syncope and collapse: Secondary | ICD-10-CM

## 2013-07-11 DIAGNOSIS — E785 Hyperlipidemia, unspecified: Secondary | ICD-10-CM

## 2013-07-11 LAB — GLUCOSE, CAPILLARY
Glucose-Capillary: 100 mg/dL — ABNORMAL HIGH (ref 70–99)
Glucose-Capillary: 140 mg/dL — ABNORMAL HIGH (ref 70–99)
Glucose-Capillary: 74 mg/dL (ref 70–99)
Glucose-Capillary: 85 mg/dL (ref 70–99)
Glucose-Capillary: 90 mg/dL (ref 70–99)
Glucose-Capillary: 96 mg/dL (ref 70–99)

## 2013-07-11 LAB — BASIC METABOLIC PANEL
CO2: 29 mEq/L (ref 19–32)
Chloride: 100 mEq/L (ref 96–112)
Creatinine, Ser: 0.55 mg/dL (ref 0.50–1.10)
GFR calc Af Amer: >90 mL/min (ref >90)
Potassium: 4.2 mEq/L (ref 3.5–5.1)
Sodium: 138 mEq/L (ref 135–145)

## 2013-07-11 LAB — CBC
HCT: 36.5 % (ref 36.0–46.0)
Hemoglobin: 12.2 g/dL (ref 12.0–15.0)
MCHC: 33.4 g/dL (ref 30.0–36.0)
MCV: 93.1 fL (ref 78.0–100.0)
RBC: 3.92 MIL/uL (ref 3.87–5.11)
RDW: 14.2 % (ref 11.5–15.5)
WBC: 5.3 10*3/uL (ref 4.0–10.5)

## 2013-07-11 NOTE — Evaluation (Signed)
Occupational Therapy Evaluation and Discharge Patient Details Name: Claudia Joyce MRN: [id number] DOB: [birth year] Today's Date: 07/11/2013 Time: 4098-1191 OT Time Calculation (min): 21 min  OT Assessment / Plan / Recommendation History of present illness Claudia Joyce is a 79 y.o. female with a Past Medical History of advanced dementia, hypertension who presents today with the above noted complaint. Patient is a resident of a local skilled nursing facility, she sustained a syncopal event today, and EMS was called, CBG at the scene was 21, she was given D50 and brought to the emergency room. A repeat CBG in the emergency room was 17. I was subsequently asked to admit this patient for further evaluation and treatment.   Clinical Impression   This 79 yo female admitted with above presents to acute OT at baseline for BADLs, but mildly off baseline for ambulation    OT Assessment  Patient does not need any further OT services    Follow Up Recommendations  No OT follow up;Supervision/Assistance - 24 hour       Equipment Recommendations  None recommended by OT          Precautions / Restrictions Precautions Precautions: Fall Restrictions Weight Bearing Restrictions: No       ADL  Equipment Used: Gait belt Transfers/Ambulation Related to ADLs: Min guard A for all without AD--mildly off balance, would hold onto rail on wall at times (per daughter this is not normal for her) changed out her socks for her slippers and balance was better but still not at baseline per daughter ADL Comments: setup/S for all     Acute Rehab OT Goals Patient Stated Goal: per daughter for pt to go back to ALF  Visit Information  Last OT Received On: 07/11/13 Assistance Needed: +1 History of Present Illness: Claudia Joyce is a 79 y.o. female with a Past Medical History of advanced dementia, hypertension who presents today with the above noted complaint. Patient is a resident of a local skilled nursing  facility, she sustained a syncopal event today, and EMS was called, CBG at the scene was 21, she was given D50 and brought to the emergency room. A repeat CBG in the emergency room was 17. I was subsequently asked to admit this patient for further evaluation and treatment.       Prior Functioning     Home Living Family/patient expects to be discharged to:: Assisted living (memory care unit) Home Equipment: None Prior Function Level of Independence: Needs assistance Gait / Transfers Assistance Needed: S without AD ADL's / Homemaking Assistance Needed: setup/S for all BADLs, does not do IADLs Communication Communication:  (minimal communication) Dominant Hand: Right         Vision/Perception Vision - History Patient Visual Report: No change from baseline   Cognition  Cognition Arousal/Alertness: Awake/alert Behavior During Therapy: WFL for tasks assessed/performed Overall Cognitive Status: History of cognitive impairments - at baseline    Extremity/Trunk Assessment Upper Extremity Assessment Upper Extremity Assessment: Overall WFL for tasks assessed     Mobility Bed Mobility Bed Mobility: Supine to Sit;Sitting - Scoot to Edge of Bed Supine to Sit: 5: Supervision;HOB elevated Sitting - Scoot to Edge of Bed: 5: Supervision Transfers Transfers: Sit to Stand;Stand to Sit Sit to Stand: 4: Min guard;With upper extremity assist;From bed Stand to Sit: 4: Min guard;With upper extremity assist;With armrests;To chair/3-in-1           End of Session OT - End of Session Equipment Utilized During Treatment: Gait belt Activity Tolerance: Patient  tolerated treatment well Patient left: in chair;with family/visitor present Nurse Communication:  (Nurse saw pt walking in hallway with me)   Functional Assessment Tool Used: Clinical observation Functional Limitation: Self care Self Care Current Status (J4782): At least 1 percent but less than 20 percent impaired, limited or  restricted Self Care Goal Status (N5621): At least 1 percent but less than 20 percent impaired, limited or restricted Self Care Discharge Status [id number]): At least 1 percent but less than 20 percent impaired, limited or restricted   Evette Georges [phone] 07/11/2013, 2:37 PM

## 2013-07-11 NOTE — Progress Notes (Addendum)
[facility]                                                                                Patient Demographics  Claudia Joyce, is a 79 y.o. female, DOB - 1924/06/19, JWJ:[id number]  Admit date - 07/10/2013   Admitting Physician Claudia Shorter Levora Dredge, MD  Outpatient Primary MD for the patient is Claudia Rude, MD  LOS - 1   Chief Complaint  Patient presents with  . Hypoglycemia        Assessment & Plan  Syncope  - Secondary to hypoglycemia  - CT head and C-spine negative, no need for further workup at this time apart from determining etiology for hypoglycemia.   Hypoglycemia  -Possibly secondary to ingestion of oral hypoglycemic agents, although patient has no history of diabetes vs poor oral intake -Sulfonylurea panel and proinsulin pending, C-peptide 3.84 -Will hold D10 infusion, and continue to monitor patient's CBG every 2 hours CBG (last 3)   Recent Labs  07/11/13 0627 07/11/13 0751 07/11/13 1007  GLUCAP 90 97 100*   Hypertension  - Continue with amlodipine   Hyponatremia  -Resolved. Na 138 -Likely secondary to dehydration.  Dementia  - Chronic issue, currently at baseline  - Continue galantamine and depakote   Principal Problem:   Syncope Active Problems:   Hypertension   Hyponatremia   Hypoglycemia   HTN (hypertension)   Dementia   Code Status: DNR  Family Communication: Attempting to contact daughter.  Disposition Plan: Admitted, still monitoring blood sugars.  Likely discharge tomorrow 07/12/13.   Procedures none  Consults  None  DVT Prophylaxis  Lovenox   Lab Results  Component Value Date   PLT 153 07/11/2013    Medications  Scheduled Meds: . amLODipine  5 mg Oral Daily  . aspirin EC  81 mg Oral Daily  . atorvastatin  10 mg Oral QHS  . cholecalciferol  400 Units Oral BID  . divalproex  250 mg Oral BID  . enoxaparin (LOVENOX) injection  40 mg Subcutaneous Q24H  . galantamine  4 mg Oral Daily  . potassium chloride  30  mEq Oral Daily   Continuous Infusions: . dextrose 10 % 1,000 mL infusion 75 mL/hr at 07/10/13 1829   PRN Meds:.acetaminophen, acetaminophen, alum & mag hydroxide-simeth, dextrose, LORazepam, ondansetron (ZOFRAN) IV, ondansetron, traMADol  Antibiotics    Anti-infectives   None       Time Spent in minutes   30 minutes   Claudia Joyce D.O. on 07/11/2013 at 10:39 AM  Between 7am to 7pm - Pager - [phone]  After 7pm go to [email or web address] - password [email or web address]  And look for the night coverage person covering for me after hours  [facility] Office  [phone]    Subjective:   Claudia Joyce seen and examined today.  Patient unable to convey much or have conversation.  Stated her first name was Claudia Joyce.  She does not answer questions appropriately.  Has dementia.    Objective:   Filed Vitals:   07/10/13 1644 07/10/13 1731 07/10/13 2029 07/11/13 0438  BP: 125/87  114/57 144/75  Pulse: 95  70 65  Temp: 97.8 F (36.6 C)  97.5 F (36.4  C) 99 F (37.2 C)  TempSrc: Oral  Oral Oral  Resp: 18  16 17   Height:  5' (1.524 m)    Weight:  48.762 kg (107 lb 8 oz)    SpO2: 98%  97% 99%    Wt Readings from Last 3 Encounters:  07/10/13 48.762 kg (107 lb 8 oz)  04/19/12 48.127 kg (106 lb 1.6 oz)     Intake/Output Summary (Last 24 hours) at 07/11/13 1039 Last data filed at 07/10/13 2000  Gross per 24 hour  Intake 353.75 ml  Output      0 ml  Net 353.75 ml    Exam  General: Well developed, well nourished, NAD, appears stated age  HEENT: NCAT, PERRLA, EOMI, Anicteic Sclera, mucous membranes moist. No pharyngeal erythema or exudates  Neck: Supple, no JVD, no masses,  Cardiovascular: S1 S2 auscultated, no rubs, murmurs or gallops.   Respiratory: Clear to auscultation bilaterally with equal chest rise  Abdomen: Soft, obese, non-tender to palpation, + bowel sounds  Extremities: warm dry without cyanosis clubbing or edema  Neuro: Awake and alert, however not  oriented.   Skin: Without rashes exudates or nodules  Psych: unable to assess   Data Review   Micro Results No results found for this or any previous visit (from the past 240 hour(s)).  Radiology Reports Ct Head Wo Contrast  07/10/2013   CLINICAL DATA:  Syncope.  EXAM: CT HEAD WITHOUT CONTRAST  TECHNIQUE: Contiguous axial images were obtained from the base of the skull through the vertex without intravenous contrast.  COMPARISON:  04/16/2012  FINDINGS: No mass lesion. No midline shift. No acute hemorrhage or hematoma. No extra-axial fluid collections. No evidence of acute infarction. There is severe temporal lobe atrophy with diffuse cerebral cortical and cerebellar atrophy. Old lacunar infarcts in the pons. No osseous abnormality.  IMPRESSION: 1. No acute intracranial abnormality. 2. Severe temporal lobe atrophy, unchanged. 3.  Old lacunar infarcts in the pons but new since 04/16/2012.   Electronically Signed   By: Geanie Cooley M.D.   On: 07/10/2013 14:24   Ct Cervical Spine Wo Contrast  07/10/2013   CLINICAL DATA:  Syncope.  EXAM: CT CERVICAL SPINE WITHOUT CONTRAST  TECHNIQUE: Multidetector CT imaging of the cervical spine was performed without intravenous contrast. Multiplanar CT image reconstructions were also generated.  COMPARISON:  Cervical radiographs dated 02/14/2013  FINDINGS: There is no acute fracture or subluxation. There is chronic subluxation of C5 on C6 of 2 mm and chronic subluxation of C7 on T1 of 2.5 mm. This is secondary to severe right facet arthritis at C5-6 and severe left facet arthritis at C7-T1 respectively. No prevertebral soft tissue swelling. There is degenerative disc and joint disease throughout the cervical spine.  IMPRESSION: No acute abnormalities of the cervical spine.   Electronically Signed   By: Geanie Cooley M.D.   On: 07/10/2013 14:30    CBC  Recent Labs Lab 07/10/13 1430 07/10/13 1750 07/11/13 0535  WBC 11.1* 8.3 5.3  HGB 11.9* 13.3 12.2  HCT  35.3* 38.8 36.5  PLT 176 162 153  MCV 91.7 91.1 93.1  MCH 30.9 31.2 31.1  MCHC 33.7 34.3 33.4  RDW 13.9 13.8 14.2  LYMPHSABS 1.4  --   --   MONOABS 0.8  --   --   EOSABS 0.0  --   --   BASOSABS 0.0  --   --     Chemistries   Recent Labs Lab 07/10/13 1430 07/10/13 1750 07/11/13  0535  NA 129*  --  138  K 3.5  --  4.2  CL 99  --  100  CO2 27  --  29  GLUCOSE 83  --  85  BUN 10  --  6  CREATININE 0.49* 0.48* 0.55  CALCIUM 8.8  --  8.8   ------------------------------------------------------------------------------------------------------------------ estimated creatinine clearance is 35.6 ml/min (by C-G formula based on Cr of 0.55). ------------------------------------------------------------------------------------------------------------------  Recent Labs  07/10/13 1750  HGBA1C 5.1   ------------------------------------------------------------------------------------------------------------------ No results found for this basename: CHOL, HDL, LDLCALC, TRIG, CHOLHDL, LDLDIRECT,  in the last 72 hours ------------------------------------------------------------------------------------------------------------------ No results found for this basename: TSH, T4TOTAL, FREET3, T3FREE, THYROIDAB,  in the last 72 hours ------------------------------------------------------------------------------------------------------------------ No results found for this basename: VITAMINB12, FOLATE, FERRITIN, TIBC, IRON, RETICCTPCT,  in the last 72 hours  Coagulation profile No results found for this basename: INR, PROTIME,  in the last 168 hours  No results found for this basename: DDIMER,  in the last 72 hours  Cardiac Enzymes No results found for this basename: CK, CKMB, TROPONINI, MYOGLOBIN,  in the last 168 hours ------------------------------------------------------------------------------------------------------------------ No components found with this basename: POCBNP,    Due to  mental status and current dementia, will also consult PT/OT for evaluation and treatment.  Patient may need more care than assisted living.

## 2013-07-11 NOTE — Evaluation (Signed)
Physical Therapy Evaluation Patient Details Name: Claudia Joyce MRN: [id number] DOB: September 01, 1924 Today's Date: 07/11/2013 Time: 4098-1191 PT Time Calculation (min): 13 min  PT Assessment / Plan / Recommendation History of Present Illness  Claudia Joyce is a 79 y.o. female with a Past Medical History of advanced dementia, hypertension who presents today with the above noted complaint. Patient is a resident of a local ALF facility (memory care unit), she sustained a syncopal event today, and EMS was called, CBG at the scene was 21, she was given D50 and brought to the emergency room. A repeat CBG in the emergency room was 17.   Clinical Impression  Pt is progressing well with her mobility. She required min hand held assist when we started walking and could transition to min guard assist.  The more we walked the more steady the pt got.  She will be appropriate to return to ALF- memory care unit at discharge.      PT Assessment  Patient needs continued PT services    Follow Up Recommendations  No PT follow up;Supervision - Intermittent    Does the patient have the potential to tolerate intense rehabilitation     NA  Barriers to Discharge   None      Equipment Recommendations  None recommended by PT    Recommendations for Other Services   None  Frequency Min 3X/week    Precautions / Restrictions Precautions Precautions: Fall Precaution Comments: mildly staggering gait pattern Restrictions Weight Bearing Restrictions: No   Pertinent Vitals/Pain See vitals flow sheet.      Mobility  Bed Mobility Bed Mobility: Not assessed (pt is OOB in recliner chair) Supine to Sit: 5: Supervision;HOB elevated Sitting - Scoot to Edge of Bed: 5: Supervision Transfers Transfers: Sit to Stand;Stand to Sit Sit to Stand: 5: Supervision;With upper extremity assist;With armrests;From chair/3-in-1;From toilet Stand to Sit: 5: Supervision;With upper extremity assist;With armrests;To  chair/3-in-1;To toilet Details for Transfer Assistance: supervision for safety Ambulation/Gait Ambulation/Gait Assistance: 4: Min guard Ambulation Distance (Feet): 300 Feet Assistive device: None Ambulation/Gait Assistance Details: pt started out with min hand held assist and then as she progressed and became more steady the further she walked it turned into min guard assist and no hand provided.   Gait Pattern: Step-through pattern (mildly staggering gait pattern that improved with distance) Gait velocity: WNL        PT Diagnosis: Difficulty walking;Abnormality of gait  PT Problem List: Decreased activity tolerance;Decreased balance;Decreased mobility PT Treatment Interventions: DME instruction;Gait training;Functional mobility training;Therapeutic activities;Therapeutic exercise;Balance training;Neuromuscular re-education;Cognitive remediation;Patient/family education     PT Goals(Current goals can be found in the care plan section) Acute Rehab PT Goals Patient Stated Goal: per daughter for pt to go back to ALF PT Goal Formulation: With family Time For Goal Achievement: 07/25/13 Potential to Achieve Goals: Good  Visit Information  Last PT Received On: 07/11/13 Assistance Needed: +1 History of Present Illness: Claudia Joyce is a 79 y.o. female with a Past Medical History of advanced dementia, hypertension who presents today with the above noted complaint. Patient is a resident of a local skilled nursing facility, she sustained a syncopal event today, and EMS was called, CBG at the scene was 21, she was given D50 and brought to the emergency room. A repeat CBG in the emergency room was 17.        Prior Functioning  Home Living Family/patient expects to be discharged to:: Assisted living (memory care unit) Home Equipment: None Prior Function Level of Independence: Needs  assistance Gait / Transfers Assistance Needed: S without AD ADL's / Homemaking Assistance Needed: setup/S for  all BADLs, does not do IADLs Communication / Swallowing Assistance Needed: NA- none Communication Communication: No difficulties Dominant Hand: Right    Cognition  Cognition Arousal/Alertness: Awake/alert Behavior During Therapy: WFL for tasks assessed/performed Overall Cognitive Status: History of cognitive impairments - at baseline    Extremity/Trunk Assessment Upper Extremity Assessment Upper Extremity Assessment: Defer to OT evaluation Lower Extremity Assessment Lower Extremity Assessment: Overall WFL for tasks assessed Cervical / Trunk Assessment Cervical / Trunk Assessment: Normal   Balance Balance Balance Assessed: Yes High Level Balance High Level Balance Activites: Turns;Direction changes High Level Balance Comments: min guard assist  End of Session PT - End of Session Activity Tolerance: Patient tolerated treatment well Patient left: in chair;with call bell/phone within reach;with family/visitor present Nurse Communication: Mobility status;Other (comment) (ok to walk with daughter in the hallway)  GP Functional Assessment Tool Used: assist level Functional Limitation: Mobility: Walking and moving around Mobility: Walking and Moving Around Current Status [phone]): At least 1 percent but less than 20 percent impaired, limited or restricted Mobility: Walking and Moving Around Goal Status [phone]): 0 percent impaired, limited or restricted   Claudia Joyce B. Ginelle Bays, PT, DPT [phone]   07/11/2013, 4:22 PM

## 2013-07-12 LAB — GLUCOSE, CAPILLARY
Glucose-Capillary: 105 mg/dL — ABNORMAL HIGH (ref 70–99)
Glucose-Capillary: 118 mg/dL — ABNORMAL HIGH (ref 70–99)
Glucose-Capillary: 112 mg/dL — ABNORMAL HIGH (ref 70–99)

## 2013-07-12 NOTE — Clinical Social Work Psychosocial (Signed)
Clinical Social Work Department BRIEF PSYCHOSOCIAL ASSESSMENT AND DISCHARGE NOTE 07/12/2013  Patient:  Claudia Joyce, Claudia Joyce     Account Number:  [id number]     Admit date:  07/10/2013  Clinical Social Worker:  Delmer Islam  Date/Time:  07/12/2013 05:10 AM  Referred by:  Physician  Date Referred:  07/12/2013 Referred for  SNF Placement   Other Referral:   Interview type:  Family Other interview type:   Daughter Claudia Joyce    PSYCHOSOCIAL DATA Living Status:  FACILITY Admitted from facility:  [facility] Level of care:  Assisted Living Primary support name:  Claudia Joyce Primary support relationship to patient:  CHILD, ADULT Degree of support available:   Daughter's phone number [phone]    CURRENT CONCERNS Current Concerns  Post-Acute Placement   Other Concerns:    SOCIAL WORK ASSESSMENT / PLAN CSW visited room and spoke briefly with patient and she is alert but not oriented to advise CSW where she lives. CSW contacted daughter and confirmed that patient would return to [facility], where she is in the memory care unit. CSW talked with Myrtie Soman and Pam at [facility] regarding discharge paperwork, assuring that the facility had the information they needed to properly care for patient per MD orders.   Assessment/plan status:  No Further Intervention Required Other assessment/ plan:   Information/referral to community resources:    PATIENT'S/FAMILY'S RESPONSE TO PLAN OF CARE: Patient discharged to [facility] 07/12/13, transported by family. Packet compiled and given to daughter to take to facility.

## 2013-07-12 NOTE — Progress Notes (Signed)
Pt prepared for d/c to [facility]. IV d/c'd. Skin intact except as most recently charted. Vitals are stable. Report called to Annice Pih at receiving facility [phone]). Pt to be transported by family.  Peri Maris, MBA, BS, RN

## 2013-07-12 NOTE — Discharge Summary (Addendum)
Physician Discharge Summary  [name]:[id number] DOB: 03-Mar-1924 DOA: 07/10/2013  PCP: Myra Rude, MD  Admit date: 07/10/2013 Discharge date: 07/12/2013  Time spent: 35 minutes  Recommendations for Outpatient Follow-up:  Sulfonylurea panel and proinsulin Check CBGs and if CBG <80, please give orange juice or other sweet substance  Discharge Diagnoses:  Principal Problem:   Syncope Active Problems:   Hypertension   Hyponatremia   Hypoglycemia   HTN (hypertension)   Dementia   Discharge Condition: improved  Diet recommendation: regular  Filed Weights   07/10/13 1731 07/11/13 2018  Weight: 48.762 kg (107 lb 8 oz) 47.769 kg (105 lb 5 oz)    History of present illness:  Claudia Joyce is a 79 y.o. female with a Past Medical History of advanced dementia, hypertension who presents today with the above noted complaint. Patient is a resident of a local skilled nursing facility, she sustained a syncopal event today, and EMS was called, CBG at the scene was 21, she was given D50 and brought to the emergency room. A repeat CBG in the emergency room was 17. I was subsequently asked to admit this patient for further evaluation and treatment.  Interestingly, patient does not have a history of diabetes, nor she is on any offending medications. Her daughter is at bedside, apparently yesterday (retrospectively) patient did have some excessive diaphoresis, and episodes of weakness and more altered behavior than usual baseline.  Patient was given 1 amp of, and D5 infusion, she is now being admitted for further treatment  During my evaluation, patient is very pleasantly confused, is awake, answers some questions appropriately and follows some commands with a lot of encouragement by her daughter.   Hospital Course:  Syncope  - Secondary to hypoglycemia  - CT head and C-spine negative, no need for further workup at this time apart from determining etiology for hypoglycemia.    Hypoglycemia  -Possibly secondary to ingestion of oral hypoglycemic agents, although patient has no history of diabetes vs poor oral intake  -Sulfonylurea panel and proinsulin pending- WILL NEED TO BE FOLLOWED UP BY PCP, C-peptide 3.84     Hypertension  - Continue with amlodipine   Hyponatremia  -Resolved. Na 138  -Likely secondary to dehydration.   Dementia  - Chronic issue, currently at baseline  - Continue galantamine and depakote      Procedures:  none  Consultations:  none  Discharge Exam: Filed Vitals:   07/12/13 1015  BP: 144/69  Pulse: 81  Temp: 97.7 F (36.5 C)  Resp: 18    General: A+Ox3, NAD Cardiovascular: rrr Respiratory: clear anterior  Discharge Instructions      Discharge Orders   Future Orders Complete By Expires   Diet general  As directed    Discharge instructions  As directed    Comments:     Check blood sugar daily in AM if CBG <80, please give orange juice or other sweet substance -encourage PO intake   Increase activity slowly  As directed        Medication List         amLODipine 5 MG tablet  Commonly known as:  NORVASC  Take 5 mg by mouth daily.     aspirin EC 81 MG tablet  Take 81 mg by mouth daily.     atorvastatin 10 MG tablet  Commonly known as:  LIPITOR  Take 10 mg by mouth at bedtime.     cholecalciferol 400 UNITS Tabs tablet  Commonly known as:  VITAMIN  D  Take 400 Units by mouth 2 (two) times daily.     Cranberry 425 MG Caps  Take 1 capsule by mouth daily.     divalproex 125 MG capsule  Commonly known as:  DEPAKOTE SPRINKLE  Take 250 mg by mouth 2 (two) times daily.     galantamine 4 MG tablet  Commonly known as:  RAZADYNE  Take 4 mg by mouth daily.     LORazepam 0.5 MG tablet  Commonly known as:  ATIVAN  Take 0.25 mg by mouth 3 (three) times daily as needed for anxiety.     potassium chloride 10 MEQ tablet  Commonly known as:  K-DUR  Take 30 mEq by mouth daily.     traMADol 50 MG tablet   Commonly known as:  ULTRAM  Take 50 mg by mouth every 6 (six) hours as needed. For pain       No Known Allergies    The results of significant diagnostics from this hospitalization (including imaging, microbiology, ancillary and laboratory) are listed below for reference.    Significant Diagnostic Studies: Ct Head Wo Contrast  07/10/2013   CLINICAL DATA:  Syncope.  EXAM: CT HEAD WITHOUT CONTRAST  TECHNIQUE: Contiguous axial images were obtained from the base of the skull through the vertex without intravenous contrast.  COMPARISON:  04/16/2012  FINDINGS: No mass lesion. No midline shift. No acute hemorrhage or hematoma. No extra-axial fluid collections. No evidence of acute infarction. There is severe temporal lobe atrophy with diffuse cerebral cortical and cerebellar atrophy. Old lacunar infarcts in the pons. No osseous abnormality.  IMPRESSION: 1. No acute intracranial abnormality. 2. Severe temporal lobe atrophy, unchanged. 3.  Old lacunar infarcts in the pons but new since 04/16/2012.   Electronically Signed   By: Geanie Cooley M.D.   On: 07/10/2013 14:24   Ct Cervical Spine Wo Contrast  07/10/2013   CLINICAL DATA:  Syncope.  EXAM: CT CERVICAL SPINE WITHOUT CONTRAST  TECHNIQUE: Multidetector CT imaging of the cervical spine was performed without intravenous contrast. Multiplanar CT image reconstructions were also generated.  COMPARISON:  Cervical radiographs dated 02/14/2013  FINDINGS: There is no acute fracture or subluxation. There is chronic subluxation of C5 on C6 of 2 mm and chronic subluxation of C7 on T1 of 2.5 mm. This is secondary to severe right facet arthritis at C5-6 and severe left facet arthritis at C7-T1 respectively. No prevertebral soft tissue swelling. There is degenerative disc and joint disease throughout the cervical spine.  IMPRESSION: No acute abnormalities of the cervical spine.   Electronically Signed   By: Geanie Cooley M.D.   On: 07/10/2013 14:30     Microbiology: No results found for this or any previous visit (from the past 240 hour(s)).   Labs: Basic Metabolic Panel:  Recent Labs Lab 07/10/13 1430 07/10/13 1750 07/11/13 0535  NA 129*  --  138  K 3.5  --  4.2  CL 99  --  100  CO2 27  --  29  GLUCOSE 83  --  85  BUN 10  --  6  CREATININE 0.49* 0.48* 0.55  CALCIUM 8.8  --  8.8   Liver Function Tests: No results found for this basename: AST, ALT, ALKPHOS, BILITOT, PROT, ALBUMIN,  in the last 168 hours No results found for this basename: LIPASE, AMYLASE,  in the last 168 hours No results found for this basename: AMMONIA,  in the last 168 hours CBC:  Recent Labs Lab 07/10/13 1430 07/10/13  1750 07/11/13 0535  WBC 11.1* 8.3 5.3  NEUTROABS 8.8*  --   --   HGB 11.9* 13.3 12.2  HCT 35.3* 38.8 36.5  MCV 91.7 91.1 93.1  PLT 176 162 153   Cardiac Enzymes: No results found for this basename: CKTOTAL, CKMB, CKMBINDEX, TROPONINI,  in the last 168 hours BNP: BNP (last 3 results) No results found for this basename: PROBNP,  in the last 8760 hours CBG:  Recent Labs Lab 07/11/13 1704 07/12/13 0355 07/12/13 0533 07/12/13 0806 07/12/13 1147  GLUCAP 140* 105* 118* 112* 116*       Signed:  Samarrah Tranchina  [facility] 07/12/2013, 2:34 PM

## 2013-07-17 LAB — PROINSULIN/INSULIN RATIO
Insulin: 30 u[IU]/mL — ABNORMAL HIGH (ref 3–19)
Proinsulin/Insulin Ratio: 9.3 % (ref 0.8–21.7)

## 2013-07-21 LAB — SULFONYLUREA HYPOGLYCEMICS PANEL, URINE
Chlorpropamide, ur: NEGATIVE ug/mL
Glyburide, ur: NEGATIVE ng/mL
Repaglinide, ur: NEGATIVE ng/mL
Tolbutamide, ur: NEGATIVE ug/mL

## 2013-08-12 ENCOUNTER — Other Ambulatory Visit: Payer: Self-pay

## 2014-02-04 ENCOUNTER — Emergency Department (HOSPITAL_COMMUNITY): Payer: Medicare Other

## 2014-02-04 ENCOUNTER — Inpatient Hospital Stay (HOSPITAL_COMMUNITY)
Admission: EM | Admit: 2014-02-04 | Discharge: 2014-02-05 | DRG: 641 | Disposition: A | Discharge status: Skilled Nursing Facility | Payer: Medicare Other | Attending: Internal Medicine | Admitting: Internal Medicine

## 2014-02-04 ENCOUNTER — Encounter (HOSPITAL_COMMUNITY): Payer: Self-pay | Admitting: Internal Medicine

## 2014-02-04 DIAGNOSIS — Z8673 Personal history of transient ischemic attack (TIA), and cerebral infarction without residual deficits: Secondary | ICD-10-CM

## 2014-02-04 DIAGNOSIS — T68XXXA Hypothermia, initial encounter: Secondary | ICD-10-CM

## 2014-02-04 DIAGNOSIS — F0390 Unspecified dementia without behavioral disturbance: Secondary | ICD-10-CM | POA: Diagnosis present

## 2014-02-04 DIAGNOSIS — M1990 Unspecified osteoarthritis, unspecified site: Secondary | ICD-10-CM | POA: Diagnosis present

## 2014-02-04 DIAGNOSIS — Z7982 Long term (current) use of aspirin: Secondary | ICD-10-CM

## 2014-02-04 DIAGNOSIS — E162 Hypoglycemia, unspecified: Principal | ICD-10-CM | POA: Diagnosis present

## 2014-02-04 DIAGNOSIS — I059 Rheumatic mitral valve disease, unspecified: Secondary | ICD-10-CM | POA: Diagnosis present

## 2014-02-04 DIAGNOSIS — R4182 Altered mental status, unspecified: Secondary | ICD-10-CM | POA: Diagnosis present

## 2014-02-04 DIAGNOSIS — R55 Syncope and collapse: Secondary | ICD-10-CM | POA: Diagnosis present

## 2014-02-04 DIAGNOSIS — E785 Hyperlipidemia, unspecified: Secondary | ICD-10-CM

## 2014-02-04 DIAGNOSIS — R680 Hypothermia, not associated with low environmental temperature: Secondary | ICD-10-CM | POA: Diagnosis present

## 2014-02-04 DIAGNOSIS — G459 Transient cerebral ischemic attack, unspecified: Secondary | ICD-10-CM

## 2014-02-04 DIAGNOSIS — I10 Essential (primary) hypertension: Secondary | ICD-10-CM | POA: Diagnosis present

## 2014-02-04 DIAGNOSIS — E7800 Pure hypercholesterolemia, unspecified: Secondary | ICD-10-CM | POA: Diagnosis present

## 2014-02-04 DIAGNOSIS — I959 Hypotension, unspecified: Secondary | ICD-10-CM

## 2014-02-04 DIAGNOSIS — Z79899 Other long term (current) drug therapy: Secondary | ICD-10-CM

## 2014-02-04 DIAGNOSIS — E871 Hypo-osmolality and hyponatremia: Secondary | ICD-10-CM

## 2014-02-04 LAB — URINALYSIS, ROUTINE W REFLEX MICROSCOPIC
Bilirubin Urine: NEGATIVE
Glucose, UA: NEGATIVE mg/dL
Hgb urine dipstick: NEGATIVE
Ketones, ur: NEGATIVE mg/dL
LEUKOCYTES UA: NEGATIVE
NITRITE: NEGATIVE
PH: 7.5 (ref 5.0–8.0)
Protein, ur: NEGATIVE mg/dL
SPECIFIC GRAVITY, URINE: 1.02 (ref 1.005–1.030)
Urobilinogen, UA: 0.2 mg/dL (ref 0.0–1.0)

## 2014-02-04 LAB — BASIC METABOLIC PANEL
BUN: 20 mg/dL (ref 6–23)
CHLORIDE: 103 meq/L (ref 96–112)
CO2: 26 mEq/L (ref 19–32)
Calcium: 8.8 mg/dL (ref 8.4–10.5)
Creatinine, Ser: 0.75 mg/dL (ref 0.50–1.10)
GFR calc Af Amer: 85 mL/min — ABNORMAL LOW (ref >90)
GFR calc non Af Amer: 73 mL/min — ABNORMAL LOW (ref >90)
Glucose, Bld: 147 mg/dL — ABNORMAL HIGH (ref 70–99)
Potassium: 3.8 mEq/L (ref 3.7–5.3)
SODIUM: 139 meq/L (ref 137–147)

## 2014-02-04 LAB — CBC WITH DIFFERENTIAL/PLATELET
Basophils Absolute: 0 10*3/uL (ref 0.0–0.1)
Basophils Relative: 0 % (ref 0–1)
Eosinophils Absolute: 0 10*3/uL (ref 0.0–0.7)
Eosinophils Relative: 0 % (ref 0–5)
HEMATOCRIT: 36.5 % (ref 36.0–46.0)
Hemoglobin: 11.7 g/dL — ABNORMAL LOW (ref 12.0–15.0)
LYMPHS PCT: 14 % (ref 12–46)
Lymphs Abs: 1 10*3/uL (ref 0.7–4.0)
MCH: 30 pg (ref 26.0–34.0)
MCHC: 32.1 g/dL (ref 30.0–36.0)
MCV: 93.6 fL (ref 78.0–100.0)
MONO ABS: 0.5 10*3/uL (ref 0.1–1.0)
Monocytes Relative: 7 % (ref 3–12)
NEUTROS ABS: 5.8 10*3/uL (ref 1.7–7.7)
Neutrophils Relative %: 79 % — ABNORMAL HIGH (ref 43–77)
Platelets: 162 10*3/uL (ref 150–400)
RBC: 3.9 MIL/uL (ref 3.87–5.11)
RDW: 14.8 % (ref 11.5–15.5)
WBC: 7.4 10*3/uL (ref 4.0–10.5)

## 2014-02-04 LAB — I-STAT CG4 LACTIC ACID, ED: LACTIC ACID, VENOUS: 1.96 mmol/L (ref 0.5–2.2)

## 2014-02-04 LAB — CBG MONITORING, ED
GLUCOSE-CAPILLARY: 26 mg/dL — AB (ref 70–99)
GLUCOSE-CAPILLARY: 136 mg/dL — AB (ref 70–99)
Glucose-Capillary: 142 mg/dL — ABNORMAL HIGH (ref 70–99)

## 2014-02-04 LAB — I-STAT TROPONIN, ED: TROPONIN I, POC: 0.01 ng/mL (ref 0.00–0.08)

## 2014-02-04 MED ORDER — VANCOMYCIN HCL IN DEXTROSE 1-5 GM/200ML-% IV SOLN
1000.0000 mg | Freq: Once | INTRAVENOUS | Status: AC
  Administered 2014-02-04: 1000 mg via INTRAVENOUS
  Filled 2014-02-04: qty 200

## 2014-02-04 MED ORDER — DEXTROSE 50 % IV SOLN
INTRAVENOUS | Status: AC
  Filled 2014-02-04: qty 50

## 2014-02-04 MED ORDER — PIPERACILLIN-TAZOBACTAM 3.375 G IVPB 30 MIN
3.3750 g | Freq: Once | INTRAVENOUS | Status: AC
  Administered 2014-02-04: 3.375 g via INTRAVENOUS
  Filled 2014-02-04: qty 50

## 2014-02-04 MED ORDER — GLUCAGON HCL (RDNA) 1 MG IJ SOLR
1.0000 mg | Freq: Once | INTRAMUSCULAR | Status: AC
  Administered 2014-02-04: 1 mg via INTRAVENOUS
  Filled 2014-02-04: qty 1

## 2014-02-04 MED ORDER — DEXTROSE 10 % IV SOLN
INTRAVENOUS | Status: DC
  Administered 2014-02-04: 21:00:00 via INTRAVENOUS

## 2014-02-04 MED ORDER — SODIUM CHLORIDE 0.9 % IV BOLUS (SEPSIS)
1000.0000 mL | Freq: Once | INTRAVENOUS | Status: AC
  Administered 2014-02-04: 1000 mL via INTRAVENOUS

## 2014-02-04 MED ORDER — DEXTROSE 50 % IV SOLN
50.0000 mL | Freq: Once | INTRAVENOUS | Status: AC
Start: 2014-02-04 — End: 2014-02-04
  Administered 2014-02-04: 50 mL via INTRAVENOUS

## 2014-02-04 NOTE — H&P (Signed)
PCP:   Myra RudeJUDGE,CATHY M, MD    Chief Complaint:  Durene Romansunresponcive  HPI: Claudia Joyce is a 80 y.o. female   has a past medical history of Dementia; TIA (transient ischemic attack); Hypertension; Mitral valve regurgitation; Hypercholesteremia; Osteoarthritis; and Insomnia.   Presented with  patient was found unresponsive at her nursing home facility. EMS was called and she was found to have BG 17 she ws given 1/4 of amp of D50 and improved to 26 she was taken to ER and was given 1/2 D50 and then started on D10 with stabilization of her blood sugar. She was noted to be hypothermic on arrival but currently improved. Patient has no family at bedside. She is unable to provide any hx due to dementia. Daughter states patient have similar admission in October with hypoglycemia lasting about 12 hours.   Hospitalist was called for admission for hypoglycemia and hypothermia  Review of Systems: unable to obtain due to patient's mental status   Past Medical History: Past Medical History  Diagnosis Date  . Dementia   . TIA (transient ischemic attack)   . Hypertension   . Mitral valve regurgitation   . Hypercholesteremia   . Osteoarthritis   . Insomnia    Past Surgical History  Procedure Laterality Date  . Abdominal hysterectomy    . Breast biopsy       Medications: Prior to Admission medications   Medication Sig Start Date End Date Taking? Authorizing Provider  aspirin EC 81 MG tablet Take 81 mg by mouth daily.   Yes Historical Provider, MD  cholecalciferol (VITAMIN D) 400 UNITS TABS Take 400 Units by mouth 2 (two) times daily.   Yes Historical Provider, MD  Cranberry 425 MG CAPS Take 1 capsule by mouth daily.   Yes Historical Provider, MD  divalproex (DEPAKOTE SPRINKLE) 125 MG capsule Take 125 mg by mouth at bedtime.    Yes Historical Provider, MD  galantamine (RAZADYNE) 4 MG tablet Take 4 mg by mouth daily.   Yes Historical Provider, MD  LORazepam (ATIVAN) 0.5 MG tablet Take 0.25 mg  by mouth 3 (three) times daily as needed for anxiety.    Yes Historical Provider, MD    Allergies:  No Known Allergies  Social History:  [place] [facility] [place]  Daughter Enis SlipperCarol Collins [phone]   reports that she has never smoked. She has never used smokeless tobacco. She reports that she does not drink alcohol or use illicit drugs.    Family History: Family history is unknown by patient.    Physical Exam: Patient Vitals for the past 24 hrs:  BP Temp Temp src Pulse Resp SpO2  02/04/14 2308 113/60 mmHg 97.8 F (36.6 C) Axillary 57 20 100 %  02/04/14 2200 135/50 mmHg - - 54 - 99 %  02/04/14 2152 128/75 mmHg 97.3 F (36.3 C) Oral 79 18 100 %  02/04/14 2109 - 93 F (33.9 C) Rectal - - -  02/04/14 2100 145/57 mmHg - - 60 25 98 %    1. General:  in No Acute distress 2. Psychological: Alert but not Oriented 3. Head/ENT:     Dry Mucous Membranes                          Head Non traumatic, neck supple                          Normal  Dentition 4.  SKIN:   decreased Skin turgor,  Skin clean Dry and intact no rash 5. Heart: Regular rate and rhythm no Murmur, Rub or gallop 6. Lungs: Clear to auscultation bilaterally, no wheezes or crackles   7. Abdomen: Soft, non-tender, Non distended 8. Lower extremities: no clubbing, cyanosis, or edema 9. Neurologically Grossly intact, moving all 4 extremities equally 10. MSK: Normal range of motion  body mass index is unknown because there is no weight on file.   Labs on Admission:   Recent Labs  02/04/14 2130  NA 139  K 3.8  CL 103  CO2 26  GLUCOSE 147*  BUN 20  CREATININE 0.75  CALCIUM 8.8   No results found for this basename: AST, ALT, ALKPHOS, BILITOT, PROT, ALBUMIN,  in the last 72 hours No results found for this basename: LIPASE, AMYLASE,  in the last 72 hours  Recent Labs  02/04/14 2130  WBC 7.4  NEUTROABS 5.8  HGB 11.7*  HCT 36.5  MCV 93.6  PLT 162   No results found for this basename:  CKTOTAL, CKMB, CKMBINDEX, TROPONINI,  in the last 72 hours No results found for this basename: TSH, T4TOTAL, FREET3, T3FREE, THYROIDAB,  in the last 72 hours No results found for this basename: VITAMINB12, FOLATE, FERRITIN, TIBC, IRON, RETICCTPCT,  in the last 72 hours Lab Results  Component Value Date   HGBA1C 5.1 07/10/2013    The CrCl is unknown because both a height and weight (above a minimum accepted value) are required for this calculation. ABG    Component Value Date/Time   TCO2 26 02/14/2013 2113     No results found for this basename: DDIMER     Other results:  I have pearsonaly reviewed this: ECG REPORT  Rate: 61  Rhythm: SR ST&T Change: w PVC's   UA concentrated but no evidence of UTI  BNP (last 3 results) No results found for this basename: PROBNP,  in the last 8760 hours  There were no vitals filed for this visit.   Cultures:    Component Value Date/Time   SDES URINE, CLEAN CATCH 02/14/2013 2232   SPECREQUEST NONE 02/14/2013 2232   CULT NO GROWTH 02/14/2013 2232   REPTSTATUS 02/15/2013 FINAL 02/14/2013 2232      Radiological Exams on Admission: Dg Chest Portable 1 View  02/04/2014   CLINICAL DATA:  Hypoglycemia.  EXAM: PORTABLE CHEST - 1 VIEW  COMPARISON:  02/14/2013  FINDINGS: The heart size and mediastinal contours are within normal limits. Both lungs are clear. The visualized skeletal structures are unremarkable.  IMPRESSION: No active disease.   Electronically Signed   By: Burman NievesWilliam  Stevens M.D.   On: 02/04/2014 21:46    Chart has been reviewed  Assessment/Plan  80 yo F with hx  Of dementia here with transient hypoglycemia and hypothermia resulting in altered mental status now improved  Present on Admission:  . Altered mental status likely secondary from hypoglycemia . Dementia - chronic continue home medications . Hypoglycemia - check TSH, insulin levels, ? Accidental insulin administration is possible. continue D10 and monitor for  hypoglycemia Hypothermia - will observe, administer IVF lactic acid wnl no evidence of infection, watch for sign of sepsis  Prophylaxis:   Lovenox, Protonix  CODE STATUS:    DNR/DNI  Other plan as per orders.  I have spent a total of 55 min on this admission  Huntington Leverich 02/04/2014, 11:14 PM

## 2014-02-04 NOTE — ED Provider Notes (Signed)
CSN: [id number]     Arrival date & time 02/04/14  2048 History   First MD Initiated Contact with Patient 02/04/14 2050     Chief Complaint  Patient presents with  . Hypoglycemia     (Consider location/radiation/quality/duration/timing/severity/associated sxs/prior Treatment) Patient is a 80 y.o. female presenting with hypoglycemia. The history is provided by the patient. The history is limited by the condition of the patient.  Hypoglycemia Initial blood sugar:  17 Blood sugar after intervention:  120 with ems, then 26 here Severity:  Severe Onset quality:  Sudden Timing:  Constant Progression:  Waxing and waning Chronicity:  New Diabetic status:  Non-diabetic Current diabetic therapy:  None Context: not decreased oral intake, not diet changes and not exercise   Relieved by:  IV glucose Associated symptoms: altered mental status   Associated symptoms: no vomiting   Altered mental status:    Severity:  Severe   Onset quality:  Sudden   Timing:  Constant   Progression:  Resolved   Level V caveat - dementia  Past Medical History  Diagnosis Date  . Dementia   . TIA (transient ischemic attack)   . Hypertension   . Mitral valve regurgitation   . Hypercholesteremia   . Osteoarthritis   . Insomnia    Past Surgical History  Procedure Laterality Date  . Abdominal hysterectomy    . Breast biopsy     No family history on file. History  Substance Use Topics  . Smoking status: Never Smoker   . Smokeless tobacco: Never Used  . Alcohol Use: No   OB History   Grav Para Term Preterm Abortions TAB SAB Ect Mult Living                 Review of Systems  Unable to perform ROS: Dementia  Gastrointestinal: Negative for vomiting.      Allergies  Review of patient's allergies indicates no known allergies.  Home Medications   Prior to Admission medications   Medication Sig Start Date End Date Taking? Authorizing Provider  amLODipine (NORVASC) 5 MG tablet Take 5 mg by  mouth daily.    Historical Provider, MD  aspirin EC 81 MG tablet Take 81 mg by mouth daily.    Historical Provider, MD  atorvastatin (LIPITOR) 10 MG tablet Take 10 mg by mouth at bedtime.    Historical Provider, MD  cholecalciferol (VITAMIN D) 400 UNITS TABS Take 400 Units by mouth 2 (two) times daily.    Historical Provider, MD  Cranberry 425 MG CAPS Take 1 capsule by mouth daily.    Historical Provider, MD  divalproex (DEPAKOTE SPRINKLE) 125 MG capsule Take 250 mg by mouth 2 (two) times daily.    Historical Provider, MD  galantamine (RAZADYNE) 4 MG tablet Take 4 mg by mouth daily.    Historical Provider, MD  LORazepam (ATIVAN) 0.5 MG tablet Take 0.25 mg by mouth 3 (three) times daily as needed for anxiety.     Historical Provider, MD  potassium chloride (K-DUR) 10 MEQ tablet Take 30 mEq by mouth daily.    Historical Provider, MD  traMADol (ULTRAM) 50 MG tablet Take 50 mg by mouth every 6 (six) hours as needed. For pain    Historical Provider, MD   There were no vitals taken for this visit. Physical Exam  Nursing note and vitals reviewed. Constitutional: She appears well-developed and well-nourished. No distress.  HENT:  Head: Normocephalic and atraumatic.  Mouth/Throat: Oropharynx is clear and moist.  Eyes: EOM are  normal. Pupils are equal, round, and reactive to light.  Neck: Normal range of motion. Neck supple.  Cardiovascular: Normal rate and regular rhythm.  Exam reveals no friction rub.   No murmur heard. Pulmonary/Chest: Effort normal and breath sounds normal. No respiratory distress. She has no wheezes. She has no rales.  Abdominal: Soft. She exhibits no distension. There is no tenderness. There is no rebound.  Musculoskeletal: Normal range of motion. She exhibits no edema.  Neurological: She is alert. She exhibits normal muscle tone.  Eyes open, responds to pain, nonverbal other than saying "ouch." Follows commands intermittently.  Skin: No rash noted. She is not diaphoretic.     ED Course  Procedures (including critical care time) Labs Review Labs Reviewed  BASIC METABOLIC PANEL  CBC WITH DIFFERENTIAL  URINALYSIS, ROUTINE W REFLEX MICROSCOPIC  CBG MONITORING, ED    Imaging Review Dg Chest Portable 1 View  02/04/2014   CLINICAL DATA:  Hypoglycemia.  EXAM: PORTABLE CHEST - 1 VIEW  COMPARISON:  02/14/2013  FINDINGS: The heart size and mediastinal contours are within normal limits. Both lungs are clear. The visualized skeletal structures are unremarkable.  IMPRESSION: No active disease.   Electronically Signed   By: Burman NievesWilliam  Stevens M.D.   On: 02/04/2014 21:46     EKG Interpretation   Date/Time:  Sunday Feb 04 2014 20:58:17 EDT Ventricular Rate:  61 PR Interval:  212 QRS Duration: 134 QT Interval:  488 QTC Calculation: 492 R Axis:   -19 Text Interpretation:  Age not entered, assumed to be  80 years old for  purpose of ECG interpretation Sinus rhythm Ventricular premature complex  Prolonged PR interval Probable left atrial enlargement Left bundle branch  block, new Confirmed by Gwendolyn GrantWALDEN  MD, Marlin Brys ([id number]) on 02/04/2014 9:33:37 PM      MDM   Final diagnoses:  Hypoglycemia    57F presents with hypoglycemia. Hx of dementia, is DNR. Not diabetic. Given meds at 7 at the nursing home like usual, doing well. Found later unresponsive. CBG 17 at that time. 1/2 amp of D50 given by EMS with improvement of blood sugar to the 120s. Patient at baseline per nursing home.  Here vitals stable. She is nonverbal, but she follows commands. Lungs clear, belly benign. Patient's initial CBG 26 here, d50 given - full amp.  Rectal temp of 93, given broad spectrum antibiotics for concern of sepsis. Daughter contacted, stated an episode like this happened once previously.  Workup without acute infectious findings. Patient at her baseline. Admitted to medicine.  Dagmar HaitWilliam Brooklyn Jeff, MD 02/04/14 [phone]

## 2014-02-04 NOTE — ED Notes (Signed)
Spoke with pt daughter, she states that this same thing happened to pt in October, she was hospitalized and her blood sugars had stabilized by the next day.  Cause for this was not found.

## 2014-02-04 NOTE — ED Notes (Signed)
Report given to Kuch RN 

## 2014-02-04 NOTE — ED Notes (Addendum)
CBG 26 edp notified

## 2014-02-04 NOTE — ED Notes (Signed)
EMS-pt is from [facility]. EMS was called out for unresponsiveness. CBG read 17. Pt was given 12.5 of dextrose via IV to 20g(R)AC. Pt became aroused to her norm after dextrose. Pt with hx of dementia. Last CBG was 97.

## 2014-02-05 LAB — COMPREHENSIVE METABOLIC PANEL
ALK PHOS: 31 U/L — AB (ref 39–117)
ALT: 11 U/L (ref 0–35)
AST: 18 U/L (ref 0–37)
Albumin: 3.2 g/dL — ABNORMAL LOW (ref 3.5–5.2)
BILIRUBIN TOTAL: 0.2 mg/dL — AB (ref 0.3–1.2)
BUN: 13 mg/dL (ref 6–23)
CO2: 24 meq/L (ref 19–32)
CREATININE: 0.58 mg/dL (ref 0.50–1.10)
Calcium: 8.7 mg/dL (ref 8.4–10.5)
Chloride: 106 mEq/L (ref 96–112)
GFR calc Af Amer: >90 mL/min (ref >90)
GFR calc non Af Amer: 80 mL/min — ABNORMAL LOW (ref >90)
Glucose, Bld: 115 mg/dL — ABNORMAL HIGH (ref 70–99)
Potassium: 3.9 mEq/L (ref 3.7–5.3)
Sodium: 141 mEq/L (ref 137–147)
Total Protein: 5.6 g/dL — ABNORMAL LOW (ref 6.0–8.3)

## 2014-02-05 LAB — CBC
HCT: 34.2 % — ABNORMAL LOW (ref 36.0–46.0)
Hemoglobin: 11.2 g/dL — ABNORMAL LOW (ref 12.0–15.0)
MCH: 30.2 pg (ref 26.0–34.0)
MCHC: 32.7 g/dL (ref 30.0–36.0)
MCV: 92.2 fL (ref 78.0–100.0)
PLATELETS: 172 10*3/uL (ref 150–400)
RBC: 3.71 MIL/uL — AB (ref 3.87–5.11)
RDW: 14.8 % (ref 11.5–15.5)
WBC: 6.6 10*3/uL (ref 4.0–10.5)

## 2014-02-05 LAB — GLUCOSE, CAPILLARY
GLUCOSE-CAPILLARY: 106 mg/dL — AB (ref 70–99)
GLUCOSE-CAPILLARY: 124 mg/dL — AB (ref 70–99)
GLUCOSE-CAPILLARY: 126 mg/dL — AB (ref 70–99)
GLUCOSE-CAPILLARY: 103 mg/dL — AB (ref 70–99)
Glucose-Capillary: 97 mg/dL (ref 70–99)
Glucose-Capillary: 135 mg/dL — ABNORMAL HIGH (ref 70–99)
Glucose-Capillary: 88 mg/dL (ref 70–99)
Glucose-Capillary: 135 mg/dL — ABNORMAL HIGH (ref 70–99)

## 2014-02-05 LAB — MAGNESIUM: Magnesium: 2 mg/dL (ref 1.5–2.5)

## 2014-02-05 LAB — PHOSPHORUS: Phosphorus: 3.4 mg/dL (ref 2.3–4.6)

## 2014-02-05 LAB — T4, FREE: FREE T4: 1.21 ng/dL (ref 0.80–1.80)

## 2014-02-05 LAB — CORTISOL: Cortisol, Plasma: 10.4 ug/dL

## 2014-02-05 LAB — C-PEPTIDE: C-Peptide: 3.15 ng/mL (ref 0.80–3.90)

## 2014-02-05 LAB — TSH: TSH: 1.31 u[IU]/mL (ref 0.350–4.500)

## 2014-02-05 MED ORDER — ACETAMINOPHEN 650 MG RE SUPP: 650.0000 mg | Freq: Four times a day (QID) | RECTAL | Status: DC | PRN

## 2014-02-05 MED ORDER — GALANTAMINE HYDROBROMIDE 4 MG PO TABS
4.0000 mg | ORAL_TABLET | Freq: Every day | ORAL | Status: DC
  Administered 2014-02-05: 4 mg via ORAL
  Filled 2014-02-05: qty 1

## 2014-02-05 MED ORDER — HYDROCODONE-ACETAMINOPHEN 5-325 MG PO TABS: 1.0000 | ORAL_TABLET | ORAL | Status: DC | PRN

## 2014-02-05 MED ORDER — DIVALPROEX SODIUM 125 MG PO CPSP
125.0000 mg | ORAL_CAPSULE | Freq: Every day | ORAL | Status: DC
  Administered 2014-02-05: 125 mg via ORAL
  Filled 2014-02-05 (×2): qty 1

## 2014-02-05 MED ORDER — ENOXAPARIN SODIUM 30 MG/0.3ML ~~LOC~~ SOLN
30.0000 mg | SUBCUTANEOUS | Status: DC
Start: 2014-02-05 — End: 2014-02-05
  Filled 2014-02-05: qty 0.3

## 2014-02-05 MED ORDER — LORAZEPAM 0.5 MG PO TABS: 0.2500 mg | ORAL_TABLET | Freq: Three times a day (TID) | ORAL | Status: DC | PRN

## 2014-02-05 MED ORDER — ACETAMINOPHEN 325 MG PO TABS: 650.0000 mg | ORAL_TABLET | Freq: Four times a day (QID) | ORAL | Status: DC | PRN

## 2014-02-05 MED ORDER — SODIUM CHLORIDE 0.9 % IJ SOLN
3.0000 mL | Freq: Two times a day (BID) | INTRAMUSCULAR | Status: DC
Start: 2014-02-05 — End: 2014-02-05
  Administered 2014-02-05: 3 mL via INTRAVENOUS

## 2014-02-05 MED ORDER — GLUCOSE 40 % PO GEL: 1.0000 | ORAL | Status: DC | PRN

## 2014-02-05 MED ORDER — GLUCOSE-VITAMIN C 4-6 GM-MG PO CHEW: 4.0000 | CHEWABLE_TABLET | ORAL | Status: DC | PRN

## 2014-02-05 MED ORDER — DOCUSATE SODIUM 100 MG PO CAPS
100.0000 mg | ORAL_CAPSULE | Freq: Two times a day (BID) | ORAL | Status: DC
  Administered 2014-02-05: 100 mg via ORAL
  Filled 2014-02-05 (×2): qty 1

## 2014-02-05 MED ORDER — DEXTROSE 50 % IV SOLN: 50.0000 mL | Freq: Once | INTRAVENOUS | Status: DC | PRN

## 2014-02-05 MED ORDER — ONDANSETRON HCL 4 MG/2ML IJ SOLN: 4.0000 mg | Freq: Four times a day (QID) | INTRAMUSCULAR | Status: DC | PRN

## 2014-02-05 MED ORDER — ASPIRIN EC 81 MG PO TBEC
81.0000 mg | DELAYED_RELEASE_TABLET | Freq: Every day | ORAL | Status: DC
  Administered 2014-02-05: 81 mg via ORAL
  Filled 2014-02-05: qty 1

## 2014-02-05 MED ORDER — DEXTROSE 50 % IV SOLN: 25.0000 mL | Freq: Once | INTRAVENOUS | Status: DC | PRN

## 2014-02-05 MED ORDER — DEXTROSE 10 % IV SOLN: INTRAVENOUS | Status: DC

## 2014-02-05 MED ORDER — ONDANSETRON HCL 4 MG PO TABS: 4.0000 mg | ORAL_TABLET | Freq: Four times a day (QID) | ORAL | Status: DC | PRN

## 2014-02-05 NOTE — Progress Notes (Signed)
IV's and tele monitor d/c at this time; pt to d/c back to [facility]; pt to be transported by daughter who is at bedside; d/c instructions give to daughter; daughter verbalized understanding; will cont. To monitor.

## 2014-02-05 NOTE — ED Notes (Signed)
Report attempted x 1

## 2014-02-05 NOTE — Progress Notes (Signed)
UR completed. Jamita Mckelvin RN CCM  

## 2014-02-05 NOTE — Discharge Summary (Signed)
Physician Discharge Summary  Claudia RutherfordDoris Joyce ZOX:[id number] DOB: June 16, 1924 DOA: 02/04/2014  PCP: Claudia Joyce  Admit date: 02/04/2014 Discharge date: 02/05/2014  Recommendations for Outpatient Follow-up:  1. Follow up insulin/proinsulin level,   Discharge Diagnoses:  Primary problem   Hypoglycemia Other diagnoses hypothermia   Dementia  Discharge Condition: stable  Filed Weights   02/05/14 0128  Weight: 52.6 kg (115 lb 15.4 oz)    History of present illness:  80 y.o. female  has a past medical history of Dementia; TIA (transient ischemic attack); Hypertension; Mitral valve regurgitation; Hypercholesteremia; Osteoarthritis; and Insomnia.  Presented with  patient was found unresponsive at her nursing home facility. EMS was called and she was found to have BG 17 she ws given 1/4 of amp of D50 and improved to 26 she was taken to ER and was given 1/2 D50 and then started on D10 with stabilization of her blood sugar. She was noted to be hypothermic on arrival but currently improved. Patient has no family at bedside. She is unable to provide any hx due to dementia. Daughter states patient have similar admission in October with hypoglycemia lasting about 12 hours  Hospital Course:  Observed overnight. No further hypoglycemia.  Cortisol 10. TSH normal.  No evidence of infection.  Insulin and proinsulin levels pending.  Recommend monitoring CBGs bid, give snacks before meals and at bedtime.  Procedures:  none  Consultations:  none  Discharge Exam: Filed Vitals:   02/05/14 1101  BP: 156/62  Pulse: 72  Temp: 98.5 F (36.9 C)  Resp: 17    General: alert Cardiovascular: RRR Respiratory: CTA abd S, NT Ext warm, no CCE  Discharge Orders   Future Orders Complete By Expires   Activity as tolerated - No restrictions  As directed    Diet general  As directed    Scheduling Instructions:   Snacks between meals and at bedtime   Discharge instructions  As directed         Medication List         aspirin EC 81 MG tablet  Take 81 mg by mouth daily.     cholecalciferol 400 UNITS Tabs tablet  Commonly known as:  VITAMIN D  Take 400 Units by mouth 2 (two) times daily.     Cranberry 425 MG Caps  Take 1 capsule by mouth daily.     divalproex 125 MG capsule  Commonly known as:  DEPAKOTE SPRINKLE  Take 125 mg by mouth at bedtime.     galantamine 4 MG tablet  Commonly known as:  RAZADYNE  Take 4 mg by mouth daily.     LORazepam 0.5 MG tablet  Commonly known as:  ATIVAN  Take 0.25 mg by mouth 3 (three) times daily as needed for anxiety.       No Known Allergies     Follow-up Information   Follow up with Claudia Joyce In 1 week.   Specialty:  Unknown Physician Specialty   Contact information:   [phone] [place] [address] [phone]        The results of significant diagnostics from this hospitalization (including imaging, microbiology, ancillary and laboratory) are listed below for reference.    Significant Diagnostic Studies: Dg Chest Portable 1 View  02/04/2014   CLINICAL DATA:  Hypoglycemia.  EXAM: PORTABLE CHEST - 1 VIEW  COMPARISON:  02/14/2013  FINDINGS: The heart size and mediastinal contours are within normal limits. Both lungs are clear. The visualized skeletal  structures are unremarkable.  IMPRESSION: No active disease.   Electronically Signed   By: Burman NievesWilliam  Stevens M.D.   On: 02/04/2014 21:46    Microbiology: No results found for this or any previous visit (from the past 240 hour(s)).   Labs: Basic Metabolic Panel:  Recent Labs Lab 02/04/14 2130 02/05/14 0520  NA 139 141  K 3.8 3.9  CL 103 106  CO2 26 24  GLUCOSE 147* 115*  BUN 20 13  CREATININE 0.75 0.58  CALCIUM 8.8 8.7  MG  --  2.0  PHOS  --  3.4   Liver Function Tests:  Recent Labs Lab 02/05/14 0520  AST 18  ALT 11  ALKPHOS 31*  BILITOT 0.2*  PROT 5.6*  ALBUMIN 3.2*   No results found for this basename: LIPASE,  AMYLASE,  in the last 168 hours No results found for this basename: AMMONIA,  in the last 168 hours CBC:  Recent Labs Lab 02/04/14 2130 02/05/14 0520  WBC 7.4 6.6  NEUTROABS 5.8  --   HGB 11.7* 11.2*  HCT 36.5 34.2*  MCV 93.6 92.2  PLT 162 172   Cardiac Enzymes: No results found for this basename: CKTOTAL, CKMB, CKMBINDEX, TROPONINI,  in the last 168 hours BNP: BNP (last 3 results) No results found for this basename: PROBNP,  in the last 8760 hours CBG:  Recent Labs Lab 02/05/14 0618 02/05/14 0814 02/05/14 1006 02/05/14 1141 02/05/14 1309  GLUCAP 126* 103* 135* 88 135*   Signed:  Christiane Haorinna L Chanah Tidmore, Joyce  [facility] 02/05/2014, 1:13 PM

## 2014-02-09 LAB — BETA-HYDROXYBUTYRIC ACID: Beta-Hydroxybutyric Acid: 0.06 mmol/L

## 2014-02-11 LAB — CULTURE, BLOOD (ROUTINE X 2)
Culture: NO GROWTH
Culture: NO GROWTH

## 2014-02-11 LAB — PROINSULIN/INSULIN RATIO
Insulin: 14 u[IU]/mL (ref 3–19)
PROINSULIN: 7.6 pmol/L (ref <=8.0)
Proinsulin/Insulin Ratio: 9 % (ref 0.8–21.7)

## 2014-06-29 IMAGING — CR DG CHEST 1V
1 series · 1 of 1 positions shown · non-contrast
Comparison: 04/16/2012

CLINICAL DATA: Chest pain

CHEST - 1 VIEW

[x chest ap]
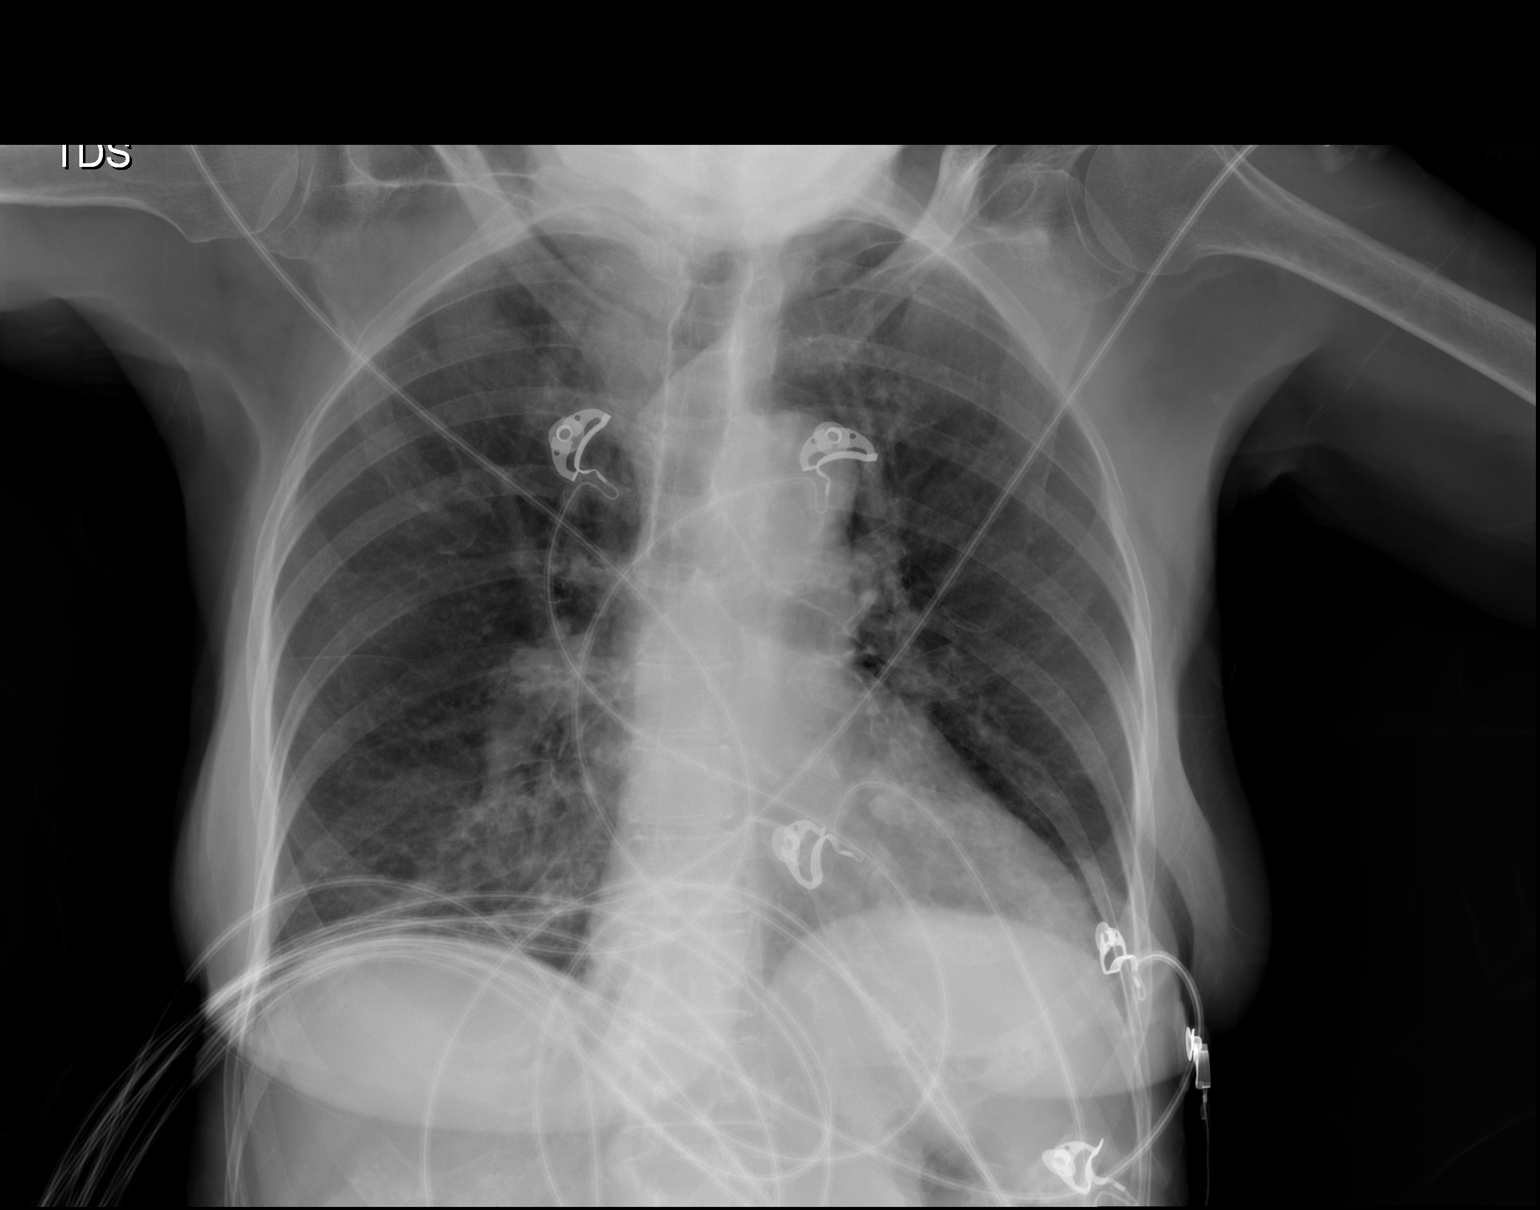

[1 of 1 positions shown; findings below may reference images not displayed]

FINDINGS: Chronic interstitial markings.  Mild bibasilar opacities,
likely atelectasis / scarring.  No pleural effusion or
pneumothorax.

Cardiomediastinal silhouette is within normal limits.
IMPRESSION: No evidence of acute cardiopulmonary disease.

## 2015-07-08 ENCOUNTER — Emergency Department (HOSPITAL_COMMUNITY): Payer: Medicare Other

## 2015-07-08 ENCOUNTER — Encounter (HOSPITAL_COMMUNITY): Payer: Self-pay | Admitting: Radiology

## 2015-07-08 ENCOUNTER — Emergency Department (HOSPITAL_COMMUNITY)
Admission: EM | Admit: 2015-07-08 | Discharge: 2015-07-08 | Disposition: A | Discharge status: Home / Self Care | Payer: Medicare Other | Attending: Emergency Medicine | Admitting: Emergency Medicine

## 2015-07-08 DIAGNOSIS — S0033XA Contusion of nose, initial encounter: Secondary | ICD-10-CM | POA: Diagnosis not present

## 2015-07-08 DIAGNOSIS — Z8673 Personal history of transient ischemic attack (TIA), and cerebral infarction without residual deficits: Secondary | ICD-10-CM | POA: Diagnosis not present

## 2015-07-08 DIAGNOSIS — Z79899 Other long term (current) drug therapy: Secondary | ICD-10-CM | POA: Insufficient documentation

## 2015-07-08 DIAGNOSIS — Z8669 Personal history of other diseases of the nervous system and sense organs: Secondary | ICD-10-CM | POA: Insufficient documentation

## 2015-07-08 DIAGNOSIS — Y998 Other external cause status: Secondary | ICD-10-CM | POA: Diagnosis not present

## 2015-07-08 DIAGNOSIS — I10 Essential (primary) hypertension: Secondary | ICD-10-CM | POA: Diagnosis not present

## 2015-07-08 DIAGNOSIS — Z8639 Personal history of other endocrine, nutritional and metabolic disease: Secondary | ICD-10-CM | POA: Diagnosis not present

## 2015-07-08 DIAGNOSIS — F0390 Unspecified dementia without behavioral disturbance: Secondary | ICD-10-CM | POA: Diagnosis not present

## 2015-07-08 DIAGNOSIS — Y9289 Other specified places as the place of occurrence of the external cause: Secondary | ICD-10-CM | POA: Diagnosis not present

## 2015-07-08 DIAGNOSIS — Z7951 Long term (current) use of inhaled steroids: Secondary | ICD-10-CM | POA: Insufficient documentation

## 2015-07-08 DIAGNOSIS — S0992XA Unspecified injury of nose, initial encounter: Secondary | ICD-10-CM | POA: Diagnosis present

## 2015-07-08 DIAGNOSIS — W19XXXA Unspecified fall, initial encounter: Secondary | ICD-10-CM

## 2015-07-08 DIAGNOSIS — Y9389 Activity, other specified: Secondary | ICD-10-CM | POA: Insufficient documentation

## 2015-07-08 DIAGNOSIS — W1839XA Other fall on same level, initial encounter: Secondary | ICD-10-CM | POA: Diagnosis not present

## 2015-07-08 DIAGNOSIS — Z7982 Long term (current) use of aspirin: Secondary | ICD-10-CM | POA: Diagnosis not present

## 2015-07-08 DIAGNOSIS — M1990 Unspecified osteoarthritis, unspecified site: Secondary | ICD-10-CM | POA: Diagnosis not present

## 2015-07-08 NOTE — ED Notes (Signed)
Pts daughter, [name], states that Pt is a DNR but there is no paperwork present to support this.

## 2015-07-08 NOTE — ED Notes (Signed)
Pt from [facility] unit.  Unwitnessed fall.  Per staff Pt is currently at her baseline (mumbles, not oriented to self).  Small abraison to front of nose.  BP 118/66  P 86 RR14 CBG 137

## 2015-07-08 NOTE — ED Notes (Signed)
MD at bedside. 

## 2015-07-08 NOTE — Discharge Instructions (Signed)
Return here as needed.  Follow-up with your primary care doctor °

## 2015-07-08 NOTE — ED Notes (Signed)
Pt given crackers and ginger ale.  Pt also walked to bathroom with assistance

## 2015-07-08 NOTE — ED Provider Notes (Signed)
CSN: [id number]     Arrival date & time 07/08/15  1505 History   First MD Initiated Contact with Patient 07/08/15 1532     Chief Complaint  Patient presents with  . Fall     (Consider location/radiation/quality/duration/timing/severity/associated sxs/prior Treatment) HPI Patient presents to the emergency department following a fall that occurred prior to arrival.  The patient has dementia and is a phasic and cannot tell me about the incident.  Staff found the patient next to her bed and she had an abrasion to the bridge of her nose.  There is no apparent other injuries.  Patient was sent to the emergency department for evaluation.  Patient is unable to give any history due to dementia Past Medical History  Diagnosis Date  . Dementia   . TIA (transient ischemic attack)   . Hypertension   . Mitral valve regurgitation   . Hypercholesteremia   . Osteoarthritis   . Insomnia    Past Surgical History  Procedure Laterality Date  . Abdominal hysterectomy    . Breast biopsy     Family History  Problem Relation Age of Onset  . Family history unknown: Yes   Social History  Substance Use Topics  . Smoking status: Never Smoker   . Smokeless tobacco: Never Used  . Alcohol Use: No   OB History    No data available     Review of Systems Level V caveat applies due to dementia  Allergies  Review of patient's allergies indicates no known allergies.  Home Medications   Prior to Admission medications   Medication Sig Start Date End Date Taking? Authorizing Provider  acetaminophen (TYLENOL) 500 MG tablet Take 500 mg by mouth 3 (three) times daily.   Yes Historical Provider, MD  ALPRAZolam (XANAX) 0.25 MG tablet Take 0.25 mg by mouth every 12 (twelve) hours.   Yes Historical Provider, MD  aspirin EC 81 MG tablet Take 81 mg by mouth daily.   Yes Historical Provider, MD  Cranberry 425 MG CAPS Take 1 capsule by mouth daily.   Yes Historical Provider, MD  ENSURE PLUS (ENSURE PLUS) LIQD  Take 237 mLs by mouth daily as needed (when refuses meal.).   Yes Historical Provider, MD  fluticasone (FLONASE) 50 MCG/ACT nasal spray Place 2 sprays into both nostrils daily.   Yes Historical Provider, MD  galantamine (RAZADYNE) 4 MG tablet Take 4 mg by mouth daily.   Yes Historical Provider, MD  loratadine (CLARITIN) 10 MG tablet Take 10 mg by mouth daily.   Yes Historical Provider, MD  polyethylene glycol (MIRALAX / GLYCOLAX) packet Take 17 g by mouth daily.   Yes Historical Provider, MD  risperiDONE (RISPERDAL) 0.5 MG tablet Take 0.5 mg by mouth 2 (two) times daily.   Yes Historical Provider, MD  senna (SENOKOT) 8.6 MG tablet Take 1 tablet by mouth every morning.   Yes Historical Provider, MD  senna (SENOKOT) 8.6 MG TABS tablet Take 1 tablet by mouth at bedtime.   Yes Historical Provider, MD  traMADol (ULTRAM) 50 MG tablet Take 25 mg by mouth every 6 (six) hours as needed for moderate pain.   Yes Historical Provider, MD  triamcinolone (KENALOG) 0.1 % paste Use as directed 1 application in the mouth or throat 4 (four) times daily as needed (ulcers).   Yes Historical Provider, MD  triamcinolone cream (KENALOG) 0.1 % Apply 1 application topically 2 (two) times daily as needed (rash).   Yes Historical Provider, MD  Vitamin D, Ergocalciferol, (DRISDOL)  50000 UNITS CAPS capsule Take 50,000 Units by mouth every 7 (seven) days.   Yes Historical Provider, MD   BP 147/69 mmHg  Pulse 70  Temp(Src)   Resp 14  SpO2 98% Physical Exam  Constitutional: She appears well-developed and well-nourished. No distress.  HENT:  Head: Normocephalic.  Nose:    Eyes: Pupils are equal, round, and reactive to light.  Neck: Normal range of motion. Neck supple.  Cardiovascular: Normal rate, regular rhythm and normal heart sounds.  Exam reveals no gallop and no friction rub.   No murmur heard. Pulmonary/Chest: Effort normal and breath sounds normal. No respiratory distress. She has no wheezes.  Musculoskeletal:        Right hip: Normal.       Left hip: Normal.  Neurological: She is alert. She exhibits normal muscle tone. Coordination normal.  Skin: Skin is warm and dry. No rash noted. No erythema.  Psychiatric: She has a normal mood and affect. Her behavior is normal.  Nursing note and vitals reviewed.   ED Course  Procedures (including critical care time) Labs Review Labs Reviewed - No data to display  Imaging Review Ct Head Wo Contrast  07/08/2015   CLINICAL DATA:  Unwitnessed fall, dementia.  EXAM: CT HEAD WITHOUT CONTRAST  CT CERVICAL SPINE WITHOUT CONTRAST  TECHNIQUE: Multidetector CT imaging of the head and cervical spine was performed following the standard protocol without intravenous contrast. Multiplanar CT image reconstructions of the cervical spine were also generated.  COMPARISON:  None.  FINDINGS: CT HEAD FINDINGS  There is diffuse brain atrophy, at least moderate in degree for age, with commensurate dilatation of the ventricles and sulci. Mild chronic small vessel ischemic change noted within the deep periventricular white matter. There are chronic calcified atherosclerotic changes of the large vessels at the skull base.  There is no mass, hemorrhage, edema, or other evidence of acute parenchymal abnormality. No extra-axial hemorrhage. No osseous fracture or dislocation. Visualized upper paranasal sinuses are clear. Mastoid air cells are clear. Superficial soft tissues are unremarkable.  CT CERVICAL SPINE FINDINGS  Degenerative changes are seen throughout the cervical spine, moderate in degree, with associated disc space narrowings and osseous spurring. There is no more than mild central canal stenosis identified at any level.  Slight anterolisthesis of C5 on C6 is likely related to the underlying degenerative changes. Alignment is otherwise normal. No fracture line or displaced fracture fragment identified. Facet joints appear well aligned. Atherosclerotic calcifications noted at each carotid  bulb region. Paravertebral soft tissues otherwise unremarkable. Mild scarring/ fibrosis noted at each lung apex.  IMPRESSION: 1. Atrophy and chronic ischemic changes, as detailed above. No evidence of acute intracranial abnormality. No intracranial mass, hemorrhage, or edema. No skull fracture. 2. Degenerative changes throughout the cervical spine, moderate in degree. No acute fracture or subluxation identified within the cervical spine.   Electronically Signed   By: Bary Richard M.D.   On: 07/08/2015 17:20   Ct Cervical Spine Wo Contrast  07/08/2015   CLINICAL DATA:  Unwitnessed fall, dementia.  EXAM: CT HEAD WITHOUT CONTRAST  CT CERVICAL SPINE WITHOUT CONTRAST  TECHNIQUE: Multidetector CT imaging of the head and cervical spine was performed following the standard protocol without intravenous contrast. Multiplanar CT image reconstructions of the cervical spine were also generated.  COMPARISON:  None.  FINDINGS: CT HEAD FINDINGS  There is diffuse brain atrophy, at least moderate in degree for age, with commensurate dilatation of the ventricles and sulci. Mild chronic small vessel ischemic change  noted within the deep periventricular white matter. There are chronic calcified atherosclerotic changes of the large vessels at the skull base.  There is no mass, hemorrhage, edema, or other evidence of acute parenchymal abnormality. No extra-axial hemorrhage. No osseous fracture or dislocation. Visualized upper paranasal sinuses are clear. Mastoid air cells are clear. Superficial soft tissues are unremarkable.  CT CERVICAL SPINE FINDINGS  Degenerative changes are seen throughout the cervical spine, moderate in degree, with associated disc space narrowings and osseous spurring. There is no more than mild central canal stenosis identified at any level.  Slight anterolisthesis of C5 on C6 is likely related to the underlying degenerative changes. Alignment is otherwise normal. No fracture line or displaced fracture  fragment identified. Facet joints appear well aligned. Atherosclerotic calcifications noted at each carotid bulb region. Paravertebral soft tissues otherwise unremarkable. Mild scarring/ fibrosis noted at each lung apex.  IMPRESSION: 1. Atrophy and chronic ischemic changes, as detailed above. No evidence of acute intracranial abnormality. No intracranial mass, hemorrhage, or edema. No skull fracture. 2. Degenerative changes throughout the cervical spine, moderate in degree. No acute fracture or subluxation identified within the cervical spine.   Electronically Signed   By: Bary Richard M.D.   On: 07/08/2015 17:20   I have personally reviewed and evaluated these images and lab results as part of my medical decision-making.  Patient will be sent to the nursing facility.  CT scans were reviewed and is no significant abnormality.  Family is given the plan and all questions were answered   Charlestine Night, PA-C 07/09/15 2150  Arby Barrette, MD 07/11/15 [phone]

## 2015-07-08 NOTE — ED Notes (Signed)
Bed: ZO10 Expected date:  Expected time:  Means of arrival:  Comments: EMS- 81yo F, fall/facial abrasions

## 2015-07-08 NOTE — ED Notes (Signed)
C Collar removed from Pt following CT results.

## 2015-08-01 ENCOUNTER — Encounter (HOSPITAL_COMMUNITY): Payer: Self-pay | Admitting: Emergency Medicine

## 2015-08-01 ENCOUNTER — Emergency Department (HOSPITAL_COMMUNITY)
Admission: EM | Admit: 2015-08-01 | Discharge: 2015-08-02 | Disposition: A | Discharge status: Home / Self Care | Payer: Medicare Other | Attending: Emergency Medicine | Admitting: Emergency Medicine

## 2015-08-01 ENCOUNTER — Emergency Department (HOSPITAL_COMMUNITY): Payer: Medicare Other

## 2015-08-01 DIAGNOSIS — Z7952 Long term (current) use of systemic steroids: Secondary | ICD-10-CM | POA: Insufficient documentation

## 2015-08-01 DIAGNOSIS — Y998 Other external cause status: Secondary | ICD-10-CM | POA: Diagnosis not present

## 2015-08-01 DIAGNOSIS — Z8639 Personal history of other endocrine, nutritional and metabolic disease: Secondary | ICD-10-CM | POA: Diagnosis not present

## 2015-08-01 DIAGNOSIS — M1990 Unspecified osteoarthritis, unspecified site: Secondary | ICD-10-CM | POA: Diagnosis not present

## 2015-08-01 DIAGNOSIS — S0101XA Laceration without foreign body of scalp, initial encounter: Secondary | ICD-10-CM | POA: Insufficient documentation

## 2015-08-01 DIAGNOSIS — W1839XA Other fall on same level, initial encounter: Secondary | ICD-10-CM | POA: Insufficient documentation

## 2015-08-01 DIAGNOSIS — Z8673 Personal history of transient ischemic attack (TIA), and cerebral infarction without residual deficits: Secondary | ICD-10-CM | POA: Insufficient documentation

## 2015-08-01 DIAGNOSIS — I10 Essential (primary) hypertension: Secondary | ICD-10-CM | POA: Insufficient documentation

## 2015-08-01 DIAGNOSIS — Z8669 Personal history of other diseases of the nervous system and sense organs: Secondary | ICD-10-CM | POA: Diagnosis not present

## 2015-08-01 DIAGNOSIS — IMO0002 Reserved for concepts with insufficient information to code with codable children: Secondary | ICD-10-CM

## 2015-08-01 DIAGNOSIS — Y9389 Activity, other specified: Secondary | ICD-10-CM | POA: Insufficient documentation

## 2015-08-01 DIAGNOSIS — Y9289 Other specified places as the place of occurrence of the external cause: Secondary | ICD-10-CM | POA: Insufficient documentation

## 2015-08-01 DIAGNOSIS — Z79899 Other long term (current) drug therapy: Secondary | ICD-10-CM | POA: Insufficient documentation

## 2015-08-01 DIAGNOSIS — F0390 Unspecified dementia without behavioral disturbance: Secondary | ICD-10-CM | POA: Diagnosis not present

## 2015-08-01 DIAGNOSIS — S0990XA Unspecified injury of head, initial encounter: Secondary | ICD-10-CM | POA: Diagnosis present

## 2015-08-01 MED ORDER — TETANUS-DIPHTH-ACELL PERTUSSIS 5-2.5-18.5 LF-MCG/0.5 IM SUSP
0.5000 mL | Freq: Once | INTRAMUSCULAR | Status: AC
  Administered 2015-08-01: 0.5 mL via INTRAMUSCULAR
  Filled 2015-08-01: qty 0.5

## 2015-08-01 NOTE — ED Notes (Signed)
Pt presents to ED via  [facility] from [facility] [place] for fall with resulting head laceration; staff unaware of patients fall/injury; staff reports patient was found walking around room with blood to head and face- no blood on surrounding room items; pt confused at baseline per staff- hx of dementia; pt denies pain at this time; VSS at this time

## 2015-08-01 NOTE — ED Provider Notes (Signed)
CSN: [id number]   Arrival date & time 08/01/15 2242  History  By signing my name below, I, Bethel BornBritney McCollum, attest that this documentation has been prepared under the direction and in the presence of Tomasita CrumbleAdeleke Richad Ramsay, MD. Electronically Signed: Bethel BornBritney McCollum, ED Scribe. 08/01/2015. 11:39 PM.  Chief Complaint  Patient presents with  . Fall  . Head Laceration   Level V caveat secondary to dementia HPI The history is provided by the nursing home. The history is limited by the condition of the patient. No language interpreter was used.   Brought in by EMS from [facility], Claudia RutherfordDoris Joyce is a 81 y.o. female with history of dementia, TIA, HTN, and hypercholesteremia who presents to the Emergency Department complaining of an unwitnessed fall tonight at the nursing home. Associated symptoms include head laceration.  Pt denies pain.   Past Medical History  Diagnosis Date  . Dementia   . TIA (transient ischemic attack)   . Hypertension   . Mitral valve regurgitation   . Hypercholesteremia   . Osteoarthritis   . Insomnia     Past Surgical History  Procedure Laterality Date  . Abdominal hysterectomy    . Breast biopsy      Family History  Problem Relation Age of Onset  . Family history unknown: Yes    Social History  Substance Use Topics  . Smoking status: Never Smoker   . Smokeless tobacco: Never Used  . Alcohol Use: No     Review of Systems  Unable to perform ROS: Dementia   Home Medications   Prior to Admission medications   Medication Sig Start Date End Date Taking? Authorizing Provider  acetaminophen (TYLENOL) 500 MG tablet Take 500 mg by mouth 3 (three) times daily.   Yes Historical Provider, MD  ALPRAZolam (XANAX) 0.25 MG tablet Take 0.25 mg by mouth 2 (two) times daily as needed for anxiety.    Yes Historical Provider, MD  Cranberry 425 MG CAPS Take 1 capsule by mouth daily.   Yes Historical Provider, MD  Dimethicone (BAZA CLEANSE & PROTECT EX) Apply topically See  admin instructions. After each incontinent episode   Yes Historical Provider, MD  ENSURE PLUS (ENSURE PLUS) LIQD Take 237 mLs by mouth daily as needed (when refuses meal.).   Yes Historical Provider, MD  fluticasone (FLONASE) 50 MCG/ACT nasal spray Place 2 sprays into both nostrils daily.   Yes Historical Provider, MD  loratadine (CLARITIN) 10 MG tablet Take 10 mg by mouth daily.   Yes Historical Provider, MD  polyethylene glycol (MIRALAX / GLYCOLAX) packet Take 17 g by mouth daily as needed (if not bowel movement in 2 days).    Yes Historical Provider, MD  risperiDONE (RISPERDAL) 0.5 MG tablet Take 0.5 mg by mouth 2 (two) times daily.   Yes Historical Provider, MD  senna (SENOKOT) 8.6 MG TABS tablet Take 1 tablet by mouth at bedtime.   Yes Historical Provider, MD  senna-docusate (SENOKOT-S) 8.6-50 MG tablet Take 1 tablet by mouth daily.   Yes Historical Provider, MD  traMADol (ULTRAM) 50 MG tablet Take 25 mg by mouth every 6 (six) hours as needed for moderate pain.   Yes Historical Provider, MD  triamcinolone (KENALOG) 0.1 % paste Use as directed 1 application in the mouth or throat 4 (four) times daily as needed (ulcers).   Yes Historical Provider, MD  triamcinolone cream (KENALOG) 0.1 % Apply 1 application topically 2 (two) times daily as needed (rash).   Yes Historical Provider, MD  Vitamin  D, Ergocalciferol, (DRISDOL) 50000 UNITS CAPS capsule Take 50,000 Units by mouth every 7 (seven) days. On Tuesday   Yes Historical Provider, MD    Allergies  Review of patient's allergies indicates no known allergies.  Triage Vitals: BP 140/79 mmHg  Pulse 67  Temp(Src) 97.9 F (36.6 C) (Oral)  Resp 19  SpO2 100%  Physical Exam  Constitutional: She appears well-developed and well-nourished. No distress.  HENT:  Head: Normocephalic.  Nose: Nose normal.  Mouth/Throat: Oropharynx is clear and moist. No oropharyngeal exudate.  2 cm laceration to right temporal scalp, very superficial  Eyes:  Conjunctivae and EOM are normal. Pupils are equal, round, and reactive to light. No scleral icterus.  Neck: Normal range of motion. Neck supple. No JVD present. No tracheal deviation present. No thyromegaly present.  Cervical collar in place No cervical spine tenderness  Cardiovascular: Normal rate, regular rhythm and normal heart sounds.  Exam reveals no gallop and no friction rub.   No murmur heard. Pulmonary/Chest: Effort normal and breath sounds normal. No respiratory distress. She has no wheezes. She exhibits no tenderness.  Abdominal: Soft. Bowel sounds are normal. She exhibits no distension and no mass. There is no tenderness. There is no rebound and no guarding.  Musculoskeletal: Normal range of motion. She exhibits no edema or tenderness.  Lymphadenopathy:    She has no cervical adenopathy.  Neurological: She is alert. No cranial nerve deficit. She exhibits normal muscle tone.  Responds to physical stimuli Moves all 4 extremities  Skin: Skin is warm and dry. No rash noted. No erythema. No pallor.  Nursing note and vitals reviewed.   ED Course  Procedures   DIAGNOSTIC STUDIES: Oxygen Saturation is 100% on RA, normal by my interpretation.    COORDINATION OF CARE: 11:06 PM Treatment plan includes CT head, CT cervical spine, laceration repair, and Tdap.    Labs Reviewed - No data to display  Imaging Review No results found.  I personally reviewed and evaluated these images as a part of my medical decision-making.     MDM   Final diagnoses:  None   Patient presents after an unwitnessed fall.  CT scan ordered and lac repaired with staples.  Tetanus shot updated today.  VS appears within her normal limits, she looks well and in NAD.  She has no complaints.  CT shows no acute findings.  Patient is safe for DC.  LACERATION REPAIR Performed by: Tomasita Crumble Authorized byTomasita Crumble Consent: Verbal consent obtained. Risks and benefits: risks, benefits and alternatives  were discussed Consent given by: patient Patient identity confirmed: provided demographic data Prepped and Draped in normal sterile fashion Wound explored  Laceration Location: R temporal area  Laceration Length: 2 cm  No Foreign Bodies seen or palpated  Irrigation method: syringe Amount of cleaning: standard  Skin closure: staples  Number of staples: 3  Patient tolerance: Patient tolerated the procedure well with no immediate complications.    Tomasita Crumble, MD 08/02/15 [id number]

## 2015-08-01 NOTE — ED Notes (Signed)
Daughter called and wants update upon dispo

## 2015-08-02 NOTE — Discharge Instructions (Signed)
Laceration Care, Adult  Claudia Joyce, your CT scan did not show any injuries.  You need to see your primary care doctor within 3 days for a wound check to evaluate your staples.  Come back for any worsening. Thank you. A laceration is a cut that goes through all layers of the skin. The cut also goes into the tissue that is right under the skin. Some cuts heal on their own. Others need to be closed with stitches (sutures), staples, skin adhesive strips, or wound glue. Taking care of your cut lowers your risk of infection and helps your cut to heal better. HOW TO TAKE CARE OF YOUR CUT For stitches or staples:  Keep the wound clean and dry.  If you were given a bandage (dressing), you should change it at least one time per day or as told by your doctor. You should also change it if it gets wet or dirty.  Keep the wound completely dry for the first 24 hours or as told by your doctor. After that time, you may take a shower or a bath. However, make sure that the wound is not soaked in water until after the stitches or staples have been removed.  Clean the wound one time each day or as told by your doctor:  Wash the wound with soap and water.  Rinse the wound with water until all of the soap comes off.  Pat the wound dry with a clean towel. Do not rub the wound.  After you clean the wound, put a thin layer of antibiotic ointment on it as told by your doctor. This ointment:  Helps to prevent infection.  Keeps the bandage from sticking to the wound.  Have your stitches or staples removed as told by your doctor. If your doctor used skin adhesive strips:   Keep the wound clean and dry.  If you were given a bandage, you should change it at least one time per day or as told by your doctor. You should also change it if it gets dirty or wet.  Do not get the skin adhesive strips wet. You can take a shower or a bath, but be careful to keep the wound dry.  If the wound gets wet, pat it dry with a  clean towel. Do not rub the wound.  Skin adhesive strips fall off on their own. You can trim the strips as the wound heals. Do not remove any strips that are still stuck to the wound. They will fall off after a while. If your doctor used wound glue:  Try to keep your wound dry, but you may briefly wet it in the shower or bath. Do not soak the wound in water, such as by swimming.  After you take a shower or a bath, gently pat the wound dry with a clean towel. Do not rub the wound.  Do not do any activities that will make you really sweaty until the skin glue has fallen off on its own.  Do not apply liquid, cream, or ointment medicine to your wound while the skin glue is still on.  If you were given a bandage, you should change it at least one time per day or as told by your doctor. You should also change it if it gets dirty or wet.  If a bandage is placed over the wound, do not let the tape for the bandage touch the skin glue.  Do not pick at the glue. The skin glue usually stays  on for 5-10 days. Then, it falls off of the skin. General Instructions  To help prevent scarring, make sure to cover your wound with sunscreen whenever you are outside after stitches are removed, after adhesive strips are removed, or when wound glue stays in place and the wound is healed. Make sure to wear a sunscreen of at least 30 SPF.  Take over-the-counter and prescription medicines only as told by your doctor.  If you were given antibiotic medicine or ointment, take or apply it as told by your doctor. Do not stop using the antibiotic even if your wound is getting better.  Do not scratch or pick at the wound.  Keep all follow-up visits as told by your doctor. This is important.  Check your wound every day for signs of infection. Watch for:  Redness, swelling, or pain.  Fluid, blood, or pus.  Raise (elevate) the injured area above the level of your heart while you are sitting or lying down, if  possible. GET HELP IF:  You got a tetanus shot and you have any of these problems at the injection site:  Swelling.  Very bad pain.  Redness.  Bleeding.  You have a fever.  A wound that was closed breaks open.  You notice a bad smell coming from your wound or your bandage.  You notice something coming out of the wound, such as wood or glass.  Medicine does not help your pain.  You have more redness, swelling, or pain at the site of your wound.  You have fluid, blood, or pus coming from your wound.  You notice a change in the color of your skin near your wound.  You need to change the bandage often because fluid, blood, or pus is coming from the wound.  You start to have a new rash.  You start to have numbness around the wound. GET HELP RIGHT AWAY IF:  You have very bad swelling around the wound.  Your pain suddenly gets worse and is very bad.  You notice painful lumps near the wound or on skin that is anywhere on your body.  You have a red streak going away from your wound.  The wound is on your hand or foot and you cannot move a finger or toe like you usually can.  The wound is on your hand or foot and you notice that your fingers or toes look pale or bluish.   This information is not intended to replace advice given to you by your health care provider. Make sure you discuss any questions you have with your health care provider.   Document Released: 03/02/2008 Document Revised: 01/29/2015 Document Reviewed: 09/10/2014 Elsevier Interactive Patient Education [employer].

## 2015-11-04 ENCOUNTER — Emergency Department (HOSPITAL_COMMUNITY)

## 2015-11-04 ENCOUNTER — Observation Stay (HOSPITAL_COMMUNITY)
Admission: EM | Admit: 2015-11-04 | Discharge: 2015-11-04 | Disposition: A | Discharge status: Home / Self Care | Attending: Internal Medicine | Admitting: Internal Medicine

## 2015-11-04 ENCOUNTER — Encounter (HOSPITAL_COMMUNITY): Payer: Self-pay | Admitting: *Deleted

## 2015-11-04 DIAGNOSIS — Z79899 Other long term (current) drug therapy: Secondary | ICD-10-CM | POA: Diagnosis not present

## 2015-11-04 DIAGNOSIS — E860 Dehydration: Secondary | ICD-10-CM | POA: Diagnosis not present

## 2015-11-04 DIAGNOSIS — R4182 Altered mental status, unspecified: Secondary | ICD-10-CM | POA: Diagnosis present

## 2015-11-04 DIAGNOSIS — G4700 Insomnia, unspecified: Secondary | ICD-10-CM | POA: Diagnosis not present

## 2015-11-04 DIAGNOSIS — E870 Hyperosmolality and hypernatremia: Secondary | ICD-10-CM | POA: Diagnosis not present

## 2015-11-04 DIAGNOSIS — F0390 Unspecified dementia without behavioral disturbance: Secondary | ICD-10-CM | POA: Diagnosis not present

## 2015-11-04 DIAGNOSIS — J690 Pneumonitis due to inhalation of food and vomit: Secondary | ICD-10-CM | POA: Diagnosis not present

## 2015-11-04 DIAGNOSIS — E7800 Pure hypercholesterolemia, unspecified: Secondary | ICD-10-CM | POA: Diagnosis not present

## 2015-11-04 DIAGNOSIS — Z515 Encounter for palliative care: Secondary | ICD-10-CM

## 2015-11-04 DIAGNOSIS — Z8673 Personal history of transient ischemic attack (TIA), and cerebral infarction without residual deficits: Secondary | ICD-10-CM | POA: Diagnosis not present

## 2015-11-04 DIAGNOSIS — L8990 Pressure ulcer of unspecified site, unspecified stage: Secondary | ICD-10-CM | POA: Insufficient documentation

## 2015-11-04 DIAGNOSIS — I340 Nonrheumatic mitral (valve) insufficiency: Secondary | ICD-10-CM | POA: Diagnosis not present

## 2015-11-04 DIAGNOSIS — M1990 Unspecified osteoarthritis, unspecified site: Secondary | ICD-10-CM | POA: Insufficient documentation

## 2015-11-04 DIAGNOSIS — Z7951 Long term (current) use of inhaled steroids: Secondary | ICD-10-CM | POA: Insufficient documentation

## 2015-11-04 LAB — I-STAT CG4 LACTIC ACID, ED
LACTIC ACID, VENOUS: 2.35 mmol/L — AB (ref 0.5–2.0)
Lactic Acid, Venous: 1.9 mmol/L (ref 0.5–2.0)

## 2015-11-04 LAB — CBC WITH DIFFERENTIAL/PLATELET
BASOS ABS: 0 10*3/uL (ref 0.0–0.1)
BASOS PCT: 0 %
Basophils Absolute: 0 10*3/uL (ref 0.0–0.1)
Basophils Relative: 0 %
EOS ABS: 0 10*3/uL (ref 0.0–0.7)
EOS PCT: 0 %
Eosinophils Absolute: 0 10*3/uL (ref 0.0–0.7)
Eosinophils Relative: 0 %
HCT: 39.5 % (ref 36.0–46.0)
HEMATOCRIT: 46.1 % — AB (ref 36.0–46.0)
HEMOGLOBIN: 12.4 g/dL (ref 12.0–15.0)
HEMOGLOBIN: 14.7 g/dL (ref 12.0–15.0)
LYMPHS ABS: 2.5 10*3/uL (ref 0.7–4.0)
LYMPHS PCT: 10 %
LYMPHS PCT: 16 %
Lymphs Abs: 2 10*3/uL (ref 0.7–4.0)
MCH: 31.3 pg (ref 26.0–34.0)
MCH: 31.8 pg (ref 26.0–34.0)
MCHC: 31.4 g/dL (ref 30.0–36.0)
MCHC: 31.9 g/dL (ref 30.0–36.0)
MCV: 99.7 fL (ref 78.0–100.0)
MCV: 99.8 fL (ref 78.0–100.0)
MONO ABS: 0.5 10*3/uL (ref 0.1–1.0)
MONOS PCT: 3 %
MONOS PCT: 4 %
Monocytes Absolute: 0.7 10*3/uL (ref 0.1–1.0)
NEUTROS ABS: 17.9 10*3/uL — AB (ref 1.7–7.7)
NEUTROS PCT: 80 %
NEUTROS PCT: 87 %
Neutro Abs: 12.9 10*3/uL — ABNORMAL HIGH (ref 1.7–7.7)
Platelets: 214 10*3/uL (ref 150–400)
Platelets: 255 10*3/uL (ref 150–400)
RBC: 3.96 MIL/uL (ref 3.87–5.11)
RBC: 4.62 MIL/uL (ref 3.87–5.11)
RDW: 15.1 % (ref 11.5–15.5)
RDW: 15.1 % (ref 11.5–15.5)
WBC: 16.1 10*3/uL — AB (ref 4.0–10.5)
WBC: 20.4 10*3/uL — ABNORMAL HIGH (ref 4.0–10.5)

## 2015-11-04 LAB — COMPREHENSIVE METABOLIC PANEL
ALBUMIN: 3.7 g/dL (ref 3.5–5.0)
ALK PHOS: 36 U/L — AB (ref 38–126)
ALT: 28 U/L (ref 14–54)
AST: 19 U/L (ref 15–41)
Anion gap: 12 (ref 5–15)
BILIRUBIN TOTAL: 1.1 mg/dL (ref 0.3–1.2)
BUN: 38 mg/dL — AB (ref 6–20)
CALCIUM: 10.2 mg/dL (ref 8.9–10.3)
CO2: 30 mmol/L (ref 22–32)
CREATININE: 0.93 mg/dL (ref 0.44–1.00)
Chloride: 120 mmol/L — ABNORMAL HIGH (ref 101–111)
GFR calc Af Amer: >60 mL/min (ref >60)
GFR calc non Af Amer: 53 mL/min — ABNORMAL LOW (ref >60)
GLUCOSE: 169 mg/dL — AB (ref 65–99)
Potassium: 4.7 mmol/L (ref 3.5–5.1)
SODIUM: 162 mmol/L — AB (ref 135–145)
TOTAL PROTEIN: 7.2 g/dL (ref 6.5–8.1)

## 2015-11-04 LAB — URINE MICROSCOPIC-ADD ON

## 2015-11-04 LAB — BASIC METABOLIC PANEL
Anion gap: 9 (ref 5–15)
BUN: 37 mg/dL — AB (ref 6–20)
CHLORIDE: 121 mmol/L — AB (ref 101–111)
CO2: 29 mmol/L (ref 22–32)
CREATININE: 0.76 mg/dL (ref 0.44–1.00)
Calcium: 8.9 mg/dL (ref 8.9–10.3)
GFR calc Af Amer: >60 mL/min (ref >60)
GFR calc non Af Amer: >60 mL/min (ref >60)
Glucose, Bld: 136 mg/dL — ABNORMAL HIGH (ref 65–99)
POTASSIUM: 3.9 mmol/L (ref 3.5–5.1)
SODIUM: 159 mmol/L — AB (ref 135–145)

## 2015-11-04 LAB — URINALYSIS, ROUTINE W REFLEX MICROSCOPIC
GLUCOSE, UA: NEGATIVE mg/dL
HGB URINE DIPSTICK: NEGATIVE
Ketones, ur: NEGATIVE mg/dL
Nitrite: NEGATIVE
Protein, ur: 30 mg/dL — AB
SPECIFIC GRAVITY, URINE: 1.031 — AB (ref 1.005–1.030)
pH: 6 (ref 5.0–8.0)

## 2015-11-04 LAB — CBG MONITORING, ED: Glucose-Capillary: 140 mg/dL — ABNORMAL HIGH (ref 65–99)

## 2015-11-04 LAB — TROPONIN I: Troponin I: 0.15 ng/mL — ABNORMAL HIGH (ref <0.031)

## 2015-11-04 MED ORDER — SENNA 8.6 MG PO TABS
1.0000 | ORAL_TABLET | Freq: Every day | ORAL | Status: DC
  Filled 2015-11-04: qty 1

## 2015-11-04 MED ORDER — SODIUM CHLORIDE 0.9 % IV SOLN
Freq: Once | INTRAVENOUS | Status: AC
  Administered 2015-11-04: 02:00:00 via INTRAVENOUS

## 2015-11-04 MED ORDER — RISPERIDONE 0.5 MG PO TABS
0.5000 mg | ORAL_TABLET | Freq: Two times a day (BID) | ORAL | Status: DC
  Filled 2015-11-04 (×2): qty 1

## 2015-11-04 MED ORDER — SENNOSIDES-DOCUSATE SODIUM 8.6-50 MG PO TABS
1.0000 | ORAL_TABLET | Freq: Every day | ORAL | Status: DC
  Filled 2015-11-04: qty 1

## 2015-11-04 MED ORDER — MORPHINE SULFATE (CONCENTRATE) 10 MG /0.5 ML PO SOLN: 10.0000 mg | ORAL | Status: AC | PRN

## 2015-11-04 MED ORDER — ENOXAPARIN SODIUM 30 MG/0.3ML ~~LOC~~ SOLN
30.0000 mg | SUBCUTANEOUS | Status: DC
  Filled 2015-11-04: qty 0.3

## 2015-11-04 MED ORDER — CLINDAMYCIN PHOSPHATE 600 MG/50ML IV SOLN
600.0000 mg | Freq: Once | INTRAVENOUS | Status: AC
  Administered 2015-11-04: 600 mg via INTRAVENOUS
  Filled 2015-11-04: qty 50

## 2015-11-04 MED ORDER — TRIAMCINOLONE ACETONIDE 0.1 % MT PSTE: 1.0000 "application " | PASTE | Freq: Four times a day (QID) | OROMUCOSAL | Status: DC | PRN

## 2015-11-04 MED ORDER — SODIUM CHLORIDE 0.9 % IV SOLN
INTRAVENOUS | Status: DC
  Administered 2015-11-04: 03:00:00 via INTRAVENOUS

## 2015-11-04 MED ORDER — TRAMADOL HCL 50 MG PO TABS: 50.0000 mg | ORAL_TABLET | Freq: Four times a day (QID) | ORAL | Status: DC | PRN

## 2015-11-04 MED ORDER — POLYETHYLENE GLYCOL 3350 17 G PO PACK
17.0000 g | PACK | Freq: Every day | ORAL | Status: DC | PRN
  Filled 2015-11-04: qty 1

## 2015-11-04 MED ORDER — ALPRAZOLAM 0.25 MG PO TABS: 0.2500 mg | ORAL_TABLET | Freq: Two times a day (BID) | ORAL | Status: AC | PRN

## 2015-11-04 MED ORDER — TRAMADOL HCL 50 MG PO TABS: 25.0000 mg | ORAL_TABLET | Freq: Four times a day (QID) | ORAL | Status: DC | PRN

## 2015-11-04 MED ORDER — MORPHINE SULFATE (PF) 2 MG/ML IV SOLN
2.0000 mg | INTRAVENOUS | Status: DC | PRN
  Administered 2015-11-04 (×2): 2 mg via INTRAVENOUS
  Filled 2015-11-04 (×2): qty 1

## 2015-11-04 MED ORDER — CRANBERRY 425 MG PO CAPS: 1.0000 | ORAL_CAPSULE | Freq: Every day | ORAL | Status: DC

## 2015-11-04 MED ORDER — SODIUM CHLORIDE 0.9 % IV SOLN
Freq: Once | INTRAVENOUS | Status: AC
  Administered 2015-11-04: 1000 mL via INTRAVENOUS

## 2015-11-04 MED ORDER — MORPHINE SULFATE (PF) 2 MG/ML IV SOLN
2.0000 mg | Freq: Once | INTRAVENOUS | Status: AC
  Administered 2015-11-04: 2 mg via INTRAVENOUS
  Filled 2015-11-04: qty 1

## 2015-11-04 MED ORDER — CLINDAMYCIN PHOSPHATE 600 MG/50ML IV SOLN: 600.0000 mg | Freq: Three times a day (TID) | INTRAVENOUS | Status: DC

## 2015-11-04 NOTE — H&P (Signed)
History and Physical  Claudia Joyce RUE:[id number] DOB: 08/28/1924 DOA: 11/04/2015  PCP: [name], MD   Chief Complaint: AMS  History of Present Illness:  Patient is a 82 yo female with history of severe dementia who was brought here with cc of altered mental status that was noticed by staff in the nursing home where she lives ( assisted living). Patient is not communicative even at baseline. She needs help with all daily activities including feeding. She cant' tell if she is in pain or if she needs to eat/drink. She has been coughing for the last week and choked on some food last week prior to that. She also had a bed sore. Otherwise family denies that she has had symptoms of fever, chills, wheezing, dyspnea, vomiting, diarrhea.   Review of Systems:  Unable to get due to clinical status.   Past Medical and Surgical History:   Past Medical History  Diagnosis Date  . Dementia   . TIA (transient ischemic attack)   . Hypertension   . Mitral valve regurgitation   . Hypercholesteremia   . Osteoarthritis   . Insomnia    Past Surgical History  Procedure Laterality Date  . Abdominal hysterectomy    . Breast biopsy      Social History:   reports that she has never smoked. She has never used smokeless tobacco. She reports that she does not drink alcohol or use illicit drugs.   No Known Allergies  Family History  Problem Relation Age of Onset  . Family history unknown: Yes      Prior to Admission medications   Medication Sig Start Date End Date Taking? Authorizing Provider  acetaminophen (TYLENOL) 500 MG tablet Take 500 mg by mouth 3 (three) times daily.    Historical Provider, MD  ALPRAZolam Prudy Feeler) 0.25 MG tablet Take 0.25 mg by mouth 2 (two) times daily as needed for anxiety.     Historical Provider, MD  Cranberry 425 MG CAPS Take 1 capsule by mouth daily.    Historical Provider, MD  Dimethicone (BAZA CLEANSE & PROTECT EX) Apply topically See admin  instructions. After each incontinent episode    Historical Provider, MD  ENSURE PLUS (ENSURE PLUS) LIQD Take 237 mLs by mouth daily as needed (when refuses meal.).    Historical Provider, MD  fluticasone (FLONASE) 50 MCG/ACT nasal spray Place 2 sprays into both nostrils daily.    Historical Provider, MD  loratadine (CLARITIN) 10 MG tablet Take 10 mg by mouth daily.    Historical Provider, MD  polyethylene glycol (MIRALAX / GLYCOLAX) packet Take 17 g by mouth daily as needed (if not bowel movement in 2 days).     Historical Provider, MD  risperiDONE (RISPERDAL) 0.5 MG tablet Take 0.5 mg by mouth 2 (two) times daily.    Historical Provider, MD  senna (SENOKOT) 8.6 MG TABS tablet Take 1 tablet by mouth at bedtime.    Historical Provider, MD  senna-docusate (SENOKOT-S) 8.6-50 MG tablet Take 1 tablet by mouth daily.    Historical Provider, MD  traMADol (ULTRAM) 50 MG tablet Take 25 mg by mouth every 6 (six) hours as needed for moderate pain.    Historical Provider, MD  triamcinolone (KENALOG) 0.1 % paste Use as directed 1 application in the mouth or throat 4 (four) times daily as needed (ulcers).    Historical Provider, MD  triamcinolone cream (KENALOG) 0.1 % Apply 1 application topically 2 (two) times daily as needed (rash).    Historical Provider, MD  Vitamin D, Ergocalciferol, (DRISDOL) 50000 UNITS CAPS capsule Take 50,000 Units by mouth every 7 (seven) days. On Tuesday    Historical Provider, MD    Physical Exam: BP 103/64 mmHg  Pulse 99  Temp(Src) 99.3 F (37.4 C) (Rectal)  Resp 26  SpO2 97%  GENERAL : restless, maybe in pain, grinding her teeth.  HEAD: normocephalic. NOSE: No nasal discharge. THROAT: Oral cavity and pharynx : dry CARDIAC: Normal S1 and S2. No S3, S4 or murmurs LUNGS: Clear to auscultation  ABDOMEN: Positive bowel sounds. Soft, nondistended NEUROLOGICAL: tremor in left hand, grinding teeth, unable to assess, not communicative, withdraw to pain stimuli.  SKIN:  un-stagable bed sore in lower back.            Labs on Admission:  Reviewed.   Radiological Exams on Admission: Ct Head Wo Contrast  11/04/2015  CLINICAL DATA:  Acute onset of altered mental status. Uncontrollable tremors. Initial encounter. EXAM: CT HEAD WITHOUT CONTRAST TECHNIQUE: Contiguous axial images were obtained from the base of the skull through the vertex without intravenous contrast. COMPARISON:  CT of the head performed 08/01/2015 FINDINGS: There is no evidence of acute infarction, mass lesion, or intra- or extra-axial hemorrhage on CT. Prominence of the ventricles and sulci reflects moderately severe cortical volume loss. Cerebellar atrophy is noted. Scattered periventricular and subcortical white matter change likely reflects small vessel ischemic microangiopathy. The brainstem and fourth ventricle are within normal limits. The basal ganglia are unremarkable in appearance. The cerebral hemispheres demonstrate grossly normal gray-white differentiation. No mass effect or midline shift is seen. There is no evidence of fracture; visualized osseous structures are unremarkable in appearance. The orbits are within normal limits. The paranasal sinuses and mastoid air cells are well-aerated. No significant soft tissue abnormalities are seen. IMPRESSION: 1. No acute intracranial pathology seen on CT. 2. Moderately severe cortical volume loss and scattered small vessel ischemic microangiopathy. Electronically Signed   By: Roanna Raider M.D.   On: 11/04/2015 00:44   Dg Chest Portable 1 View  11/04/2015  CLINICAL DATA:  Choked on food 1 week ago. Concern for aspiration. Initial encounter. EXAM: PORTABLE CHEST 1 VIEW COMPARISON:  Chest radiograph performed 02/04/2014 FINDINGS: The lungs are well-aerated and clear. There is no evidence of focal opacification, pleural effusion or pneumothorax. There is no radiographic evidence for aspiration. The cardiomediastinal silhouette is within normal limits. No  acute osseous abnormalities are seen. IMPRESSION: No acute cardiopulmonary process seen. No radiographic evidence for aspiration. Electronically Signed   By: Roanna Raider M.D.   On: 11/04/2015 01:27    EKG:  Independently reviewed. Non specific changes.   Assessment/Plan  AMS likely due to hypernatremia,likely due to severe dementia: Will bolus with 1 L NS then continue NS at 100 cc/hr.  Recheck Na in morning Patient is hospice. Family refused multiple needles stick. They asked for hospice consult in am. They are okay with fluid and antibiotics only for now. They also asked for pain relief.  Will switch tramadol to morphine prn pain Continue clinda for possible cellulitis   Elevated trop: will not trend , after a long discussion with family they requested patient to be hospice and consult their service in am. They refused trending troponins ( refuse any ACS management).  Patient will need help with feeding.  I will continue her home meds    DVT prophylaxis: [place] enoxaparin  Consultants: Hospice in am Code Status: DNR/DNI   Eston Esters M.D [facility]

## 2015-11-04 NOTE — Progress Notes (Signed)
Patient admitted after midnight-- please see H&P Appears that hospice was following at ALF ([facility]) -patient had decline so they sent her to hospital -palliative care consult to help with d/c planning-- ? If residential hospice appropriate??  Claudia Canary DO

## 2015-11-04 NOTE — ED Notes (Addendum)
Pt from [facility] facility by EMS c/o altered mental status. Hospice nurse came to home to change bandage to sacral pressure ulcer and noted pt to be less responsive than baseline. Pt is taking haldol and lorazepam. EMS VS 120s bp 96/60 intially with pinpoint pupils and shallow breathing. Pt given  narcan with improvement of VS. Pt nonverbal, DNR paperwork at bedside

## 2015-11-04 NOTE — Care Management Note (Signed)
Case Management Note  Patient Details  Name: Faylynn Stamos MRN: [id number] Date of Birth: June 03, 1924  Subjective/Objective:       CM following for progression and d/c planning.             Action/Plan: 11/04/2015 Plan for d/c back to ALF, per Hospice RN rep, L [place] this pt would be better serviced back at the facility and monitored by Hospice provides who currently provide services for this pt and plan a transfer to [facility] when appropriate.  She also spoke with pt daughter who is in agreement . This CM notified the CSW, Oda Cogan, who will arrange the d/c and Dr Benjamine Mola informed.   Expected Discharge Date:    11/04/2015              Expected Discharge Plan:  Assisted Living / Rest Home  In-House Referral:  Clinical Social Work  Discharge planning Services  NA  Post Acute Care Choice:  NA Choice offered to:  NA  DME Arranged:  N/A DME Agency:  NA  HH Arranged:  NA HH Agency:  NA  Status of Service:  Completed, signed off  Medicare Important Message Given:    Date Medicare IM Given:    Medicare IM give by:    Date Additional Medicare IM Given:    Additional Medicare Important Message give by:     If discussed at Long Length of Stay Meetings, dates discussed:    Additional Comments:  Starlyn Skeans, RN 11/04/2015, 12:53 PM

## 2015-11-04 NOTE — ED Notes (Signed)
Pt becoming more agitated, smacking hands, and swinging at family and staff. MD made aware

## 2015-11-04 NOTE — Progress Notes (Signed)
SLP Cancellation Note  Patient Details Name: Claudia Joyce MRN: [id number] DOB: 03-24-1924   Cancelled treatment:       Reason Eval/Treat Not Completed: Other (comment)Per Rn, pt is scheduled to transfer today to residential hospice and swallow eval is no longer warranted.  Will sign off.    Blenda Mounts Laurice 11/04/2015, 1:15 PM

## 2015-11-04 NOTE — [place] FL2 (Signed)
[place] MEDICAID FL2 LEVEL OF CARE SCREENING TOOL     IDENTIFICATION  Patient Name: Claudia Joyce Birthdate: 1923-12-09 Sex: female Admission Date (Current Location): 11/04/2015  [facility] and [place] Number:  Producer, television/film/video and Address:  [facility], [address], [place], [place] [address]      Provider Number: [id number]  Attending Physician Name and Address:  Joseph Art, DO  Relative Name and Phone Number:       Current Level of Care: Hospital Recommended Level of Care: Skilled Nursing Facility, Assisted Living Facility Prior Approval Number:    Date Approved/Denied:   PASRR Number:    Discharge Plan:  (ALF- return)    Current Diagnoses: Patient Active Problem List   Diagnosis Date Noted  . Hypernatremia 11/04/2015  . Pressure ulcer 11/04/2015  . Syncope 07/10/2013  . Hypoglycemia 07/10/2013  . HTN (hypertension) 07/10/2013  . Dementia 07/10/2013  . Hyponatremia 04/17/2012  . Hypertension 04/16/2012  . Hyperlipidemia 04/16/2012  . TIA (transient ischemic attack) 04/16/2012  . Altered mental status 04/16/2012  . Hypotension 04/16/2012    Orientation RESPIRATION BLADDER Height & Weight      (Alert)  Normal Incontinent Weight:   Height:     BEHAVIORAL SYMPTOMS/MOOD NEUROLOGICAL BOWEL NUTRITION STATUS      Incontinent  (please see dc summary for dietary needs)  AMBULATORY STATUS COMMUNICATION OF NEEDS Skin   Total Care Does not communicate PU Stage and Appropriate Care (unstageable)       PU Stage 4 Dressing:  (see dc summary for wound care)               Personal Care Assistance Level of Assistance  Bathing, Feeding, Dressing, Total care Bathing Assistance: Maximum assistance Feeding assistance: Maximum assistance Dressing Assistance: Maximum assistance Total Care Assistance: Maximum assistance   Functional Limitations Info             SPECIAL CARE FACTORS FREQUENCY                        Contractures Contractures Info: Not present    Additional Factors Info  Code Status Code Status Info: DNR             Current Medications (11/04/2015):  This is the current hospital active medication list Current Facility-Administered Medications  Medication Dose Route Frequency Provider Last Rate Last Dose  . 0.9 %  sodium chloride infusion   Intravenous Continuous Eston Esters, MD 100 mL/hr at 11/04/15 0325    . clindamycin (CLEOCIN) IVPB 600 mg  600 mg Intravenous 3 times per day Eston Esters, MD   600 mg at 11/04/15 1400  . enoxaparin (LOVENOX) injection 30 mg  30 mg Subcutaneous Q24H Eston Esters, MD   30 mg at 11/04/15 0816  . morphine 2 MG/ML injection 2 mg  2 mg Intravenous Q3H PRN Eston Esters, MD   2 mg at 11/04/15 1515  . polyethylene glycol (MIRALAX / GLYCOLAX) packet 17 g  17 g Oral Daily PRN Eston Esters, MD      . risperiDONE (RISPERDAL) tablet 0.5 mg  0.5 mg Oral BID Eston Esters, MD   0.5 mg at 11/04/15 0302  . senna (SENOKOT) tablet 8.6 mg  1 tablet Oral QHS Eston Esters, MD      . senna-docusate (Senokot-S) tablet 1 tablet  1 tablet Oral Daily Eston Esters, MD   1 tablet at 11/04/15 3086     Discharge Medications: Current  Discharge Medication List    START taking these medications   Details  Morphine Sulfate (MORPHINE CONCENTRATE) 10 mg / 0.5 ml concentrated solution Place 0.5 mLs (10 mg total) under the tongue every 2 (two) hours as needed for severe pain. Qty: 0.5 mL, Refills: 0      CONTINUE these medications which have CHANGED   Details  ALPRAZolam (XANAX) 0.25 MG tablet Take 1 tablet (0.25 mg total) by mouth 2 (two) times daily as needed for anxiety. Qty: 10 tablet, Refills: 0      CONTINUE these medications which have NOT CHANGED   Details  acetaminophen (TYLENOL) 500 MG tablet Take 500 mg by mouth 3 (three) times daily.    benztropine (COGENTIN) 1 MG tablet Take 1 mg by mouth at bedtime.    Cranberry 425 MG CAPS Take 1  capsule by mouth daily.    Dimethicone (BAZA CLEANSE & PROTECT EX) Apply topically See admin instructions. After each incontinent episode    ENSURE PLUS (ENSURE PLUS) LIQD Take 237 mLs by mouth 2 (two) times daily.     guaifenesin (TUSSIN) 100 MG/5ML syrup Take 300 mg by mouth 3 (three) times daily. For 5 days    !! haloperidol (HALDOL) 2 MG tablet Take 2 mg by mouth 3 (three) times daily.    !! haloperidol (HALDOL) 2 MG tablet Take 4 mg by mouth at bedtime.    loratadine (CLARITIN) 10 MG tablet Take 10 mg by mouth daily.    polyethylene glycol (MIRALAX / GLYCOLAX) packet Take 17 g by mouth daily as needed (if not bowel movement in 2 days).     senna-docusate (SENOKOT-S) 8.6-50 MG tablet Take 1 tablet by mouth daily.           Relevant Imaging Results:  Relevant Lab Results:   Additional Information continue hospice care  [place], [place], LCSW

## 2015-11-04 NOTE — Discharge Summary (Signed)
Physician Discharge Summary  Claudia Joyce NWG:[id number] DOB: 1924-09-27 DOA: 11/04/2015  PCP: [name], MD  Admit date: 11/04/2015 Discharge date: 11/04/2015  Time spent: 35 minutes  Recommendations for Outpatient Follow-up:  1. Do not re hospitalize 2. DNR 3. Hospice to follow at ALF   Discharge Diagnoses:  Active Problems:   Hypernatremia   Pressure ulcer   Discharge Condition: terminal  Diet recommendation: as tolerated  There were no vitals filed for this visit.  History of present illness:  Patient is a 82 yo female with history of severe dementia who was brought here with cc of altered mental status that was noticed by staff in the nursing home where she lives ( assisted living). Patient is not communicative even at baseline. She needs help with all daily activities including feeding. She cant' tell if she is in pain or if she needs to eat/drink. She has been coughing for the last week and choked on some food last week prior to that. She also had a bed sore. Otherwise family denies that she has had symptoms of fever, chills, wheezing, dyspnea, vomiting, diarrhea.   Hospital Course:  Decline of hospice patient-- found to have hypernatremia -comfort care -morphine PRN -comfort feeds DNR and DO NOT REHOSPITALIZE  Procedures:    Consultations:    Discharge Exam: Filed Vitals:   11/04/15 0630 11/04/15 0820  BP: 127/56 124/60  Pulse: 66 68  Temp:  97.7 F (36.5 C)  Resp: 15 15     Discharge Instructions   Discharge Instructions    Discharge instructions    Complete by:  As directed   Diet as tolerated Hospice to follow at ALF DNR DO NOT RE-HOSPITALIZE          Current Discharge Medication List    START taking these medications   Details  Morphine Sulfate (MORPHINE CONCENTRATE) 10 mg / 0.5 ml concentrated solution Place 0.5 mLs (10 mg total) under the tongue every 2 (two) hours as needed for severe pain. Qty: 0.5 mL, Refills: 0       CONTINUE these medications which have CHANGED   Details  ALPRAZolam (XANAX) 0.25 MG tablet Take 1 tablet (0.25 mg total) by mouth 2 (two) times daily as needed for anxiety. Qty: 10 tablet, Refills: 0      CONTINUE these medications which have NOT CHANGED   Details  acetaminophen (TYLENOL) 500 MG tablet Take 500 mg by mouth 3 (three) times daily.    benztropine (COGENTIN) 1 MG tablet Take 1 mg by mouth at bedtime.    Cranberry 425 MG CAPS Take 1 capsule by mouth daily.    Dimethicone (BAZA CLEANSE & PROTECT EX) Apply topically See admin instructions. After each incontinent episode    ENSURE PLUS (ENSURE PLUS) LIQD Take 237 mLs by mouth 2 (two) times daily.     guaifenesin (TUSSIN) 100 MG/5ML syrup Take 300 mg by mouth 3 (three) times daily. For 5 days    !! haloperidol (HALDOL) 2 MG tablet Take 2 mg by mouth 3 (three) times daily.    !! haloperidol (HALDOL) 2 MG tablet Take 4 mg by mouth at bedtime.    loratadine (CLARITIN) 10 MG tablet Take 10 mg by mouth daily.    polyethylene glycol (MIRALAX / GLYCOLAX) packet Take 17 g by mouth daily as needed (if not bowel movement in 2 days).     senna-docusate (SENOKOT-S) 8.6-50 MG tablet Take 1 tablet by mouth daily.     !! - Potential duplicate medications  found. Please discuss with provider.    STOP taking these medications     triamcinolone (KENALOG) 0.1 % paste      triamcinolone cream (KENALOG) 0.1 %      traMADol (ULTRAM) 50 MG tablet      risperiDONE (RISPERDAL) 0.5 MG tablet      senna (SENOKOT) 8.6 MG TABS tablet        No Known Allergies    The results of significant diagnostics from this hospitalization (including imaging, microbiology, ancillary and laboratory) are listed below for reference.    Significant Diagnostic Studies: Ct Head Wo Contrast  11/04/2015  CLINICAL DATA:  Acute onset of altered mental status. Uncontrollable tremors. Initial encounter. EXAM: CT HEAD WITHOUT CONTRAST TECHNIQUE: Contiguous  axial images were obtained from the base of the skull through the vertex without intravenous contrast. COMPARISON:  CT of the head performed 08/01/2015 FINDINGS: There is no evidence of acute infarction, mass lesion, or intra- or extra-axial hemorrhage on CT. Prominence of the ventricles and sulci reflects moderately severe cortical volume loss. Cerebellar atrophy is noted. Scattered periventricular and subcortical white matter change likely reflects small vessel ischemic microangiopathy. The brainstem and fourth ventricle are within normal limits. The basal ganglia are unremarkable in appearance. The cerebral hemispheres demonstrate grossly normal gray-white differentiation. No mass effect or midline shift is seen. There is no evidence of fracture; visualized osseous structures are unremarkable in appearance. The orbits are within normal limits. The paranasal sinuses and mastoid air cells are well-aerated. No significant soft tissue abnormalities are seen. IMPRESSION: 1. No acute intracranial pathology seen on CT. 2. Moderately severe cortical volume loss and scattered small vessel ischemic microangiopathy. Electronically Signed   By: Roanna Raider M.D.   On: 11/04/2015 00:44   Dg Chest Portable 1 View  11/04/2015  CLINICAL DATA:  Choked on food 1 week ago. Concern for aspiration. Initial encounter. EXAM: PORTABLE CHEST 1 VIEW COMPARISON:  Chest radiograph performed 02/04/2014 FINDINGS: The lungs are well-aerated and clear. There is no evidence of focal opacification, pleural effusion or pneumothorax. There is no radiographic evidence for aspiration. The cardiomediastinal silhouette is within normal limits. No acute osseous abnormalities are seen. IMPRESSION: No acute cardiopulmonary process seen. No radiographic evidence for aspiration. Electronically Signed   By: Roanna Raider M.D.   On: 11/04/2015 01:27    Microbiology: No results found for this or any previous visit (from the past 240 hour(s)).    Labs: Basic Metabolic Panel:  Recent Labs Lab 11/04/15 0020 11/04/15 0429  NA 162* 159*  K 4.7 3.9  CL 120* 121*  CO2 30 29  GLUCOSE 169* 136*  BUN 38* 37*  CREATININE 0.93 0.76  CALCIUM 10.2 8.9   Liver Function Tests:  Recent Labs Lab 11/04/15 0020  AST 19  ALT 28  ALKPHOS 36*  BILITOT 1.1  PROT 7.2  ALBUMIN 3.7   No results for input(s): LIPASE, AMYLASE in the last 168 hours. No results for input(s): AMMONIA in the last 168 hours. CBC:  Recent Labs Lab 11/04/15 0020 11/04/15 0429  WBC 20.4* 16.1*  NEUTROABS 17.9* 12.9*  HGB 14.7 12.4  HCT 46.1* 39.5  MCV 99.8 99.7  PLT 255 214   Cardiac Enzymes:  Recent Labs Lab 11/04/15 0020  TROPONINI 0.15*   BNP: BNP (last 3 results) No results for input(s): BNP in the last 8760 hours.  ProBNP (last 3 results) No results for input(s): PROBNP in the last 8760 hours.  CBG:  Recent Labs Lab 11/04/15  0148  GLUCAP 140*       Signed:  Anastasiya Gowin U Morrisa Aldaba DO.  [facility] 11/04/2015, 1:57 PM

## 2015-11-04 NOTE — ED Notes (Addendum)
 NS bolus given per Dr.Hamad

## 2015-11-04 NOTE — ED Provider Notes (Signed)
CSN: [id number]     Arrival date & time 11/04/15  0014 History  By signing my name below, I, Ronney Lion, attest that this documentation has been prepared under the direction and in the presence of Shon Baton, MD. Electronically Signed: Ronney Lion, ED Scribe. 11/04/2015. 2:38 AM.   Chief Complaint  Patient presents with  . Altered Mental Status   The history is provided by the EMS personnel. History limited by: dementia. No language interpreter was used.     LEVEL 5 CAVEAT FOR DEMENTIA HPI Comments: Claudia Joyce is a 82 y.o. female with a history of dementia, tIA, HTN, mitral valve regurgitation, HLD, osteoarthritis, and insomnia, brought in by ambulance, who presents to the Emergency Department complaining of AMS that began PTA. Per EMS, patient's hospice nurse came home to change patient's bandage and noticed patient was less responsive than baseline. Patient has a history of tremors, which are currently at baseline. She was previously on Haldol for this, and is currently taking senna, Xanax, and tramadol. Patient is nonverbal at baseline.   Patient currently has a DNR and DNI order.   Family now the bedside. Patient is at her baseline mental status. The report that one week ago she choked and required the Heimlich maneuver. Since that time she is noted to be aspirating. She's had decreased by mouth intake. They confirm her DO NOT RESUSCITATE/DO NOT INTUBATE status. Patient was just recently placed on hospice. She has had general decline over the last 1 month.  Past Medical History  Diagnosis Date  . Dementia   . TIA (transient ischemic attack)   . Hypertension   . Mitral valve regurgitation   . Hypercholesteremia   . Osteoarthritis   . Insomnia    Past Surgical History  Procedure Laterality Date  . Abdominal hysterectomy    . Breast biopsy     Family History  Problem Relation Age of Onset  . Family history unknown: Yes   Social History  Substance Use Topics  .  Smoking status: Never Smoker   . Smokeless tobacco: Never Used  . Alcohol Use: No   OB History    No data available     Review of Systems  Unable to perform ROS: Dementia   Allergies  Review of patient's allergies indicates no known allergies.  Home Medications   Prior to Admission medications   Medication Sig Start Date End Date Taking? Authorizing Provider  acetaminophen (TYLENOL) 500 MG tablet Take 500 mg by mouth 3 (three) times daily.    Historical Provider, MD  ALPRAZolam Prudy Feeler) 0.25 MG tablet Take 0.25 mg by mouth 2 (two) times daily as needed for anxiety.     Historical Provider, MD  Cranberry 425 MG CAPS Take 1 capsule by mouth daily.    Historical Provider, MD  Dimethicone (BAZA CLEANSE & PROTECT EX) Apply topically See admin instructions. After each incontinent episode    Historical Provider, MD  ENSURE PLUS (ENSURE PLUS) LIQD Take 237 mLs by mouth daily as needed (when refuses meal.).    Historical Provider, MD  fluticasone (FLONASE) 50 MCG/ACT nasal spray Place 2 sprays into both nostrils daily.    Historical Provider, MD  loratadine (CLARITIN) 10 MG tablet Take 10 mg by mouth daily.    Historical Provider, MD  polyethylene glycol (MIRALAX / GLYCOLAX) packet Take 17 g by mouth daily as needed (if not bowel movement in 2 days).     Historical Provider, MD  risperiDONE (RISPERDAL) 0.5 MG tablet Take  0.5 mg by mouth 2 (two) times daily.    Historical Provider, MD  senna (SENOKOT) 8.6 MG TABS tablet Take 1 tablet by mouth at bedtime.    Historical Provider, MD  senna-docusate (SENOKOT-S) 8.6-50 MG tablet Take 1 tablet by mouth daily.    Historical Provider, MD  traMADol (ULTRAM) 50 MG tablet Take 25 mg by mouth every 6 (six) hours as needed for moderate pain.    Historical Provider, MD  triamcinolone (KENALOG) 0.1 % paste Use as directed 1 application in the mouth or throat 4 (four) times daily as needed (ulcers).    Historical Provider, MD  triamcinolone cream (KENALOG) 0.1  % Apply 1 application topically 2 (two) times daily as needed (rash).    Historical Provider, MD  Vitamin D, Ergocalciferol, (DRISDOL) 50000 UNITS CAPS capsule Take 50,000 Units by mouth every 7 (seven) days. On Tuesday    Historical Provider, MD   BP 103/64 mmHg  Pulse 99  Temp(Src) 99.3 F (37.4 C) (Rectal)  Resp 26  SpO2 97% Physical Exam  Constitutional:  Elderly, frail, chronically ill-appearing, no acute distress  HENT:  Head: Normocephalic and atraumatic.  Mucous membranes dry  Eyes:  Pupils 3 mm and reactive bilaterally  Cardiovascular: Normal rate and normal heart sounds.   No murmur heard. Pulmonary/Chest: Effort normal. No respiratory distress.  Rales right lower lobe  Abdominal: Soft. Bowel sounds are normal. There is no tenderness.  Musculoskeletal: Normal range of motion. She exhibits no edema.  Neurological:  Alert, does not follow commands, left upper extremity tremor noted  Skin: Skin is warm and dry.  Psychiatric: She has a normal mood and affect. Her behavior is normal.  Nursing note and vitals reviewed.   ED Course  Procedures (including critical care time)  DIAGNOSTIC STUDIES: Oxygen Saturation is 96% on RA, normal by my interpretation.    Labs Review Labs Reviewed  CBC WITH DIFFERENTIAL/PLATELET - Abnormal; Notable for the following:    WBC 20.4 (*)    HCT 46.1 (*)    Neutro Abs 17.9 (*)    All other components within normal limits  COMPREHENSIVE METABOLIC PANEL - Abnormal; Notable for the following:    Sodium 162 (*)    Chloride 120 (*)    Glucose, Bld 169 (*)    BUN 38 (*)    Alkaline Phosphatase 36 (*)    GFR calc non Af Amer 53 (*)    All other components within normal limits  TROPONIN I - Abnormal; Notable for the following:    Troponin I 0.15 (*)    All other components within normal limits  URINALYSIS, ROUTINE W REFLEX MICROSCOPIC (NOT AT [facility]) - Abnormal; Notable for the following:    Color, Urine AMBER (*)    APPearance CLOUDY (*)     Specific Gravity, Urine 1.031 (*)    Bilirubin Urine SMALL (*)    Protein, ur 30 (*)    Leukocytes, UA TRACE (*)    All other components within normal limits  URINE MICROSCOPIC-ADD ON - Abnormal; Notable for the following:    Squamous Epithelial / LPF 0-5 (*)    Bacteria, UA FEW (*)    Crystals CA OXALATE CRYSTALS (*)    All other components within normal limits  CBG MONITORING, ED - Abnormal; Notable for the following:    Glucose-Capillary 140 (*)    All other components within normal limits  I-STAT CG4 LACTIC ACID, ED - Abnormal; Notable for the following:    Lactic Acid,  Venous 2.35 (*)    All other components within normal limits  URINE CULTURE  CULTURE, BLOOD (ROUTINE X 2)  CULTURE, BLOOD (ROUTINE X 2)    Imaging Review Ct Head Wo Contrast  11/04/2015  CLINICAL DATA:  Acute onset of altered mental status. Uncontrollable tremors. Initial encounter. EXAM: CT HEAD WITHOUT CONTRAST TECHNIQUE: Contiguous axial images were obtained from the base of the skull through the vertex without intravenous contrast. COMPARISON:  CT of the head performed 08/01/2015 FINDINGS: There is no evidence of acute infarction, mass lesion, or intra- or extra-axial hemorrhage on CT. Prominence of the ventricles and sulci reflects moderately severe cortical volume loss. Cerebellar atrophy is noted. Scattered periventricular and subcortical white matter change likely reflects small vessel ischemic microangiopathy. The brainstem and fourth ventricle are within normal limits. The basal ganglia are unremarkable in appearance. The cerebral hemispheres demonstrate grossly normal gray-white differentiation. No mass effect or midline shift is seen. There is no evidence of fracture; visualized osseous structures are unremarkable in appearance. The orbits are within normal limits. The paranasal sinuses and mastoid air cells are well-aerated. No significant soft tissue abnormalities are seen. IMPRESSION: 1. No acute  intracranial pathology seen on CT. 2. Moderately severe cortical volume loss and scattered small vessel ischemic microangiopathy. Electronically Signed   By: Roanna Raider M.D.   On: 11/04/2015 00:44   Dg Chest Portable 1 View  11/04/2015  CLINICAL DATA:  Choked on food 1 week ago. Concern for aspiration. Initial encounter. EXAM: PORTABLE CHEST 1 VIEW COMPARISON:  Chest radiograph performed 02/04/2014 FINDINGS: The lungs are well-aerated and clear. There is no evidence of focal opacification, pleural effusion or pneumothorax. There is no radiographic evidence for aspiration. The cardiomediastinal silhouette is within normal limits. No acute osseous abnormalities are seen. IMPRESSION: No acute cardiopulmonary process seen. No radiographic evidence for aspiration. Electronically Signed   By: Roanna Raider M.D.   On: 11/04/2015 01:27   I have personally reviewed and evaluated these images and lab results as part of my medical decision-making.   EKG Interpretation   Date/Time:  Monday November 04 2015 00:19:26 EST Ventricular Rate:  98 PR Interval:  153 QRS Duration: 111 QT Interval:  367 QTC Calculation: 469 R Axis:     Text Interpretation:  Sinus rhythm Biatrial enlargement LVH with secondary  repolarization abnormality Probable anterior infarct, age indeterminate T  wave changes inferiorly and laterally Confirmed by Alisson Rozell  MD, Ayden Hardwick  ([id number]) on 11/04/2015 2:05:32 AM      MDM   Final diagnoses:  Dehydration  Aspiration pneumonia due to regurgitated food, unspecified laterality, unspecified part of lung (HCC)    Patient presents with altered mental status.  ABCs intact. She is DO NOT RESUSCITATE/DO NOT INTUBATE. Afebrile. Recent risk for aspiration. She appears dry. She was given fluids.  Lab work notable for significant hypernatremia with a sodium of 162. Patient also has a leukocytosis with a left shift. Mild lactic acidosis at 2.35. Patient was given fluids.  No other signs or  symptoms of sepsis. No evidence of pneumonia on chest x-ray; however, patient is so dry this may be a false negative. Patient was covered with clindamycin. Troponin minimally elevated with nonspecific changes on EKG. Per the daughter, no aggressive measures are desired. Will admit for hydration and a swallow study.   I personally performed the services described in this documentation, which was scribed in my presence. The recorded information has been reviewed and is accurate.    Shon Baton, MD  11/04/15 0241 

## 2015-11-04 NOTE — ED Notes (Signed)
Per family at bedside, pt choked on food last week, since episode pt has had decreased PO intake and persistent cough. Family concerned for aspiration

## 2015-11-04 NOTE — ED Notes (Signed)
Admitting MD at bedside.

## 2015-11-04 NOTE — Progress Notes (Signed)
[facility] notified of patients returning to them, report given. No questions at this time. [facility] here to transport back to facility.

## 2015-11-05 LAB — URINE CULTURE: CULTURE: NO GROWTH

## 2015-11-09 LAB — CULTURE, BLOOD (ROUTINE X 2)
CULTURE: NO GROWTH
CULTURE: NO GROWTH

## 2015-11-27 DEATH — deceased

## 2017-06-15 IMAGING — CT CT HEAD W/O CM
2 of 6 series · 11 of 47 positions shown, 13 images · non-contrast
Comparison: CT head and cervical spine July 08, 2015

CLINICAL DATA: Unwitnessed fall, Alzheimer's patient. RIGHT
parietal laceration.

EXAM:
CT HEAD WITHOUT CONTRAST
CT CERVICAL SPINE WITHOUT CONTRAST
TECHNIQUE: Multidetector CT imaging of the head and cervical spine was
performed following the standard protocol without intravenous
contrast. Multiplanar CT image reconstructions of the cervical spine
were also generated.

[Series 304: orthogonals · axial · 0.36mm/px · z∈[+70,+225]mm · 8 of 103 slices shown, 10 images]
[im 12/103  brain]
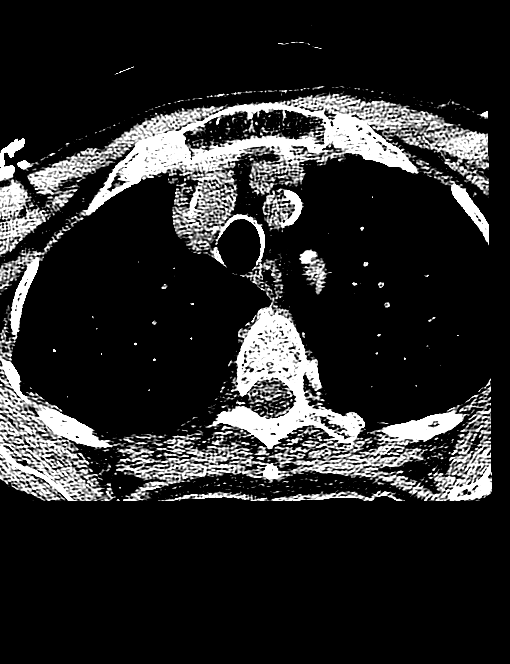
[im 12/103  bone]
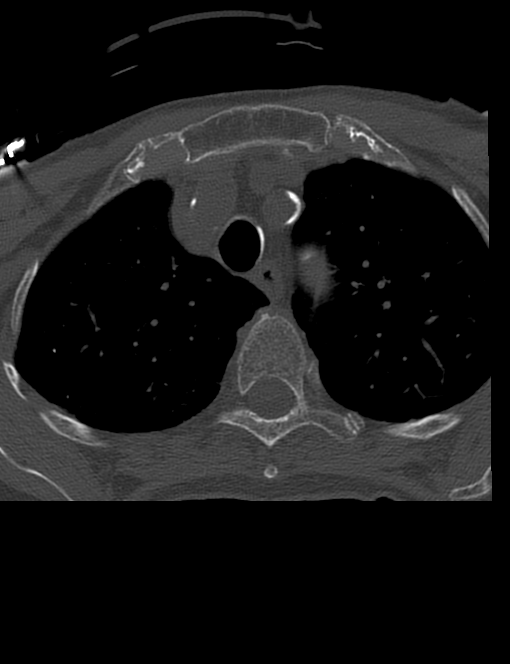
[im 23/103  brain]
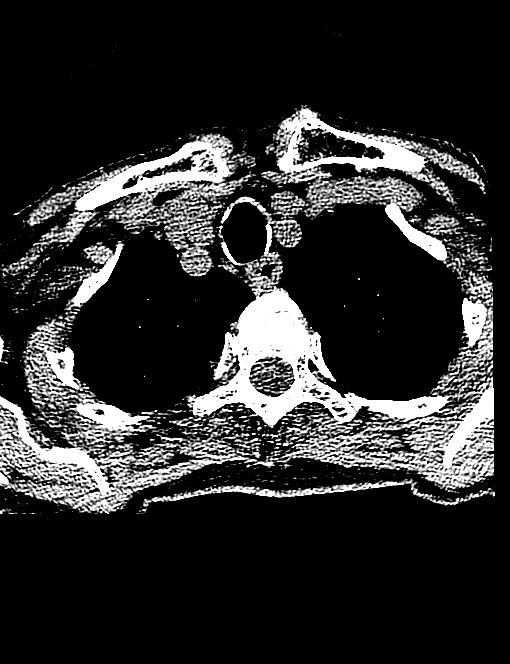
[im 35/103  brain]
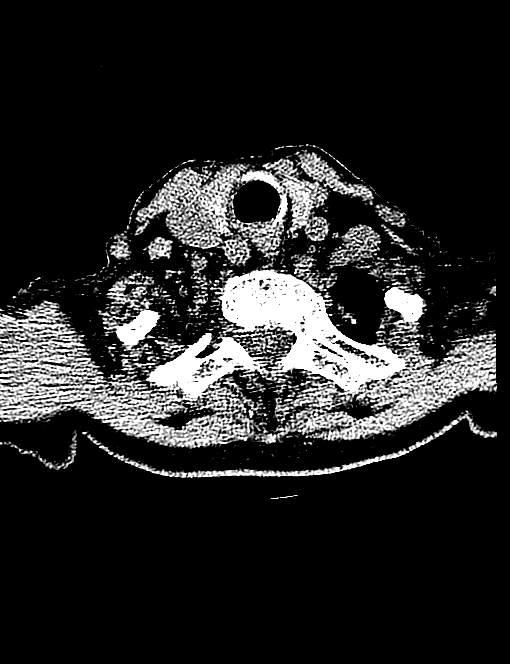
[im 46/103  brain]
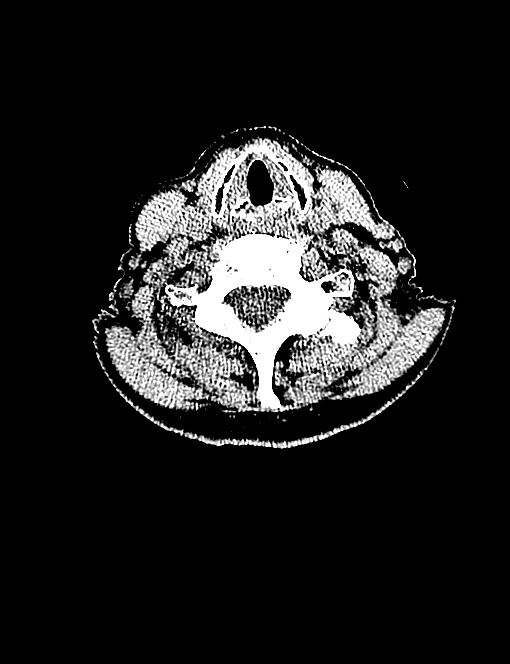
[im 57/103  brain]
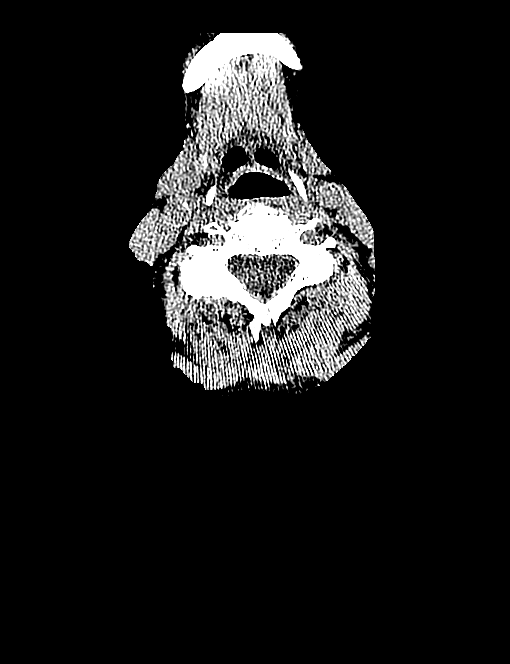
[im 57/103  bone]
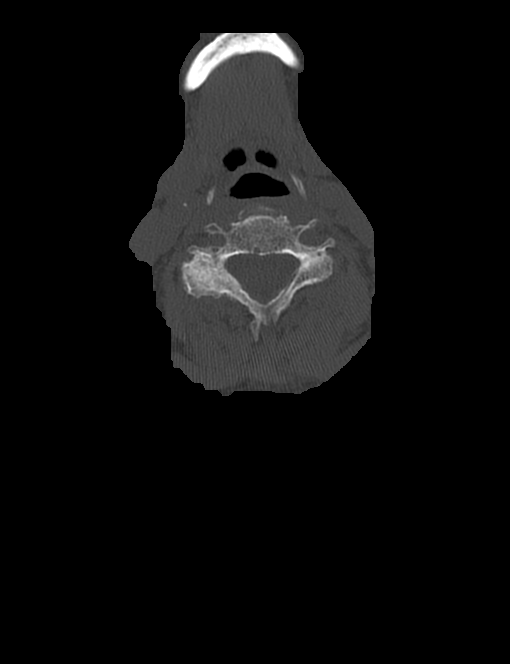
[im 69/103  brain]
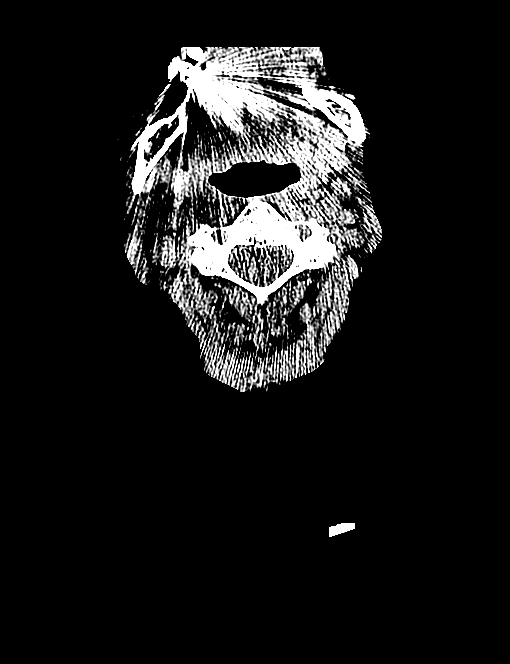
[im 80/103  brain]
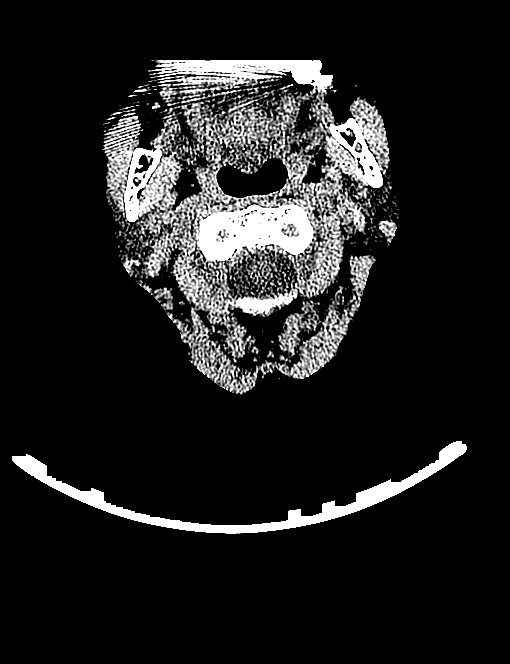
[im 91/103  brain]
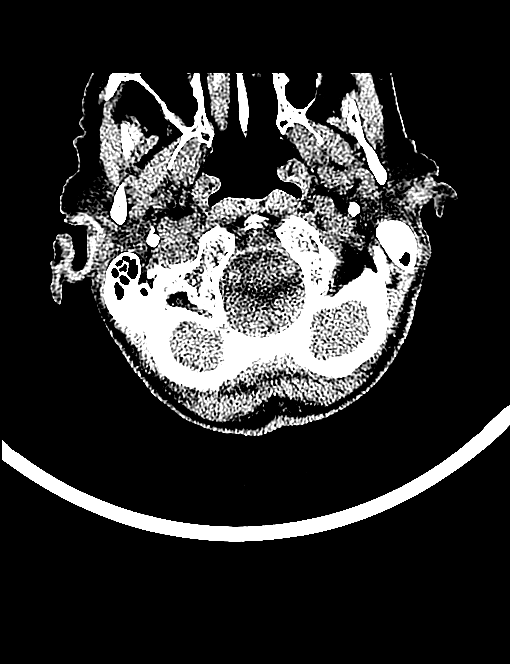

[Series 306: cor · coronal · 0.36mm/px · 3 of 41 slices shown]
[im 14/41  brain]
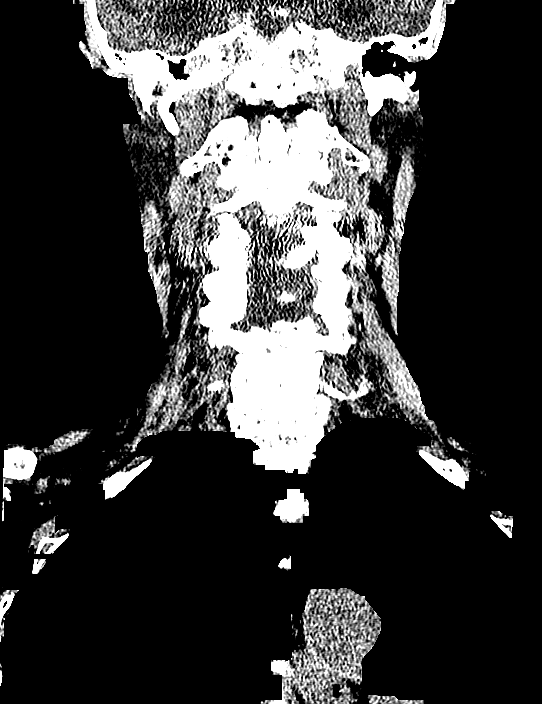
[im 18/41  brain]
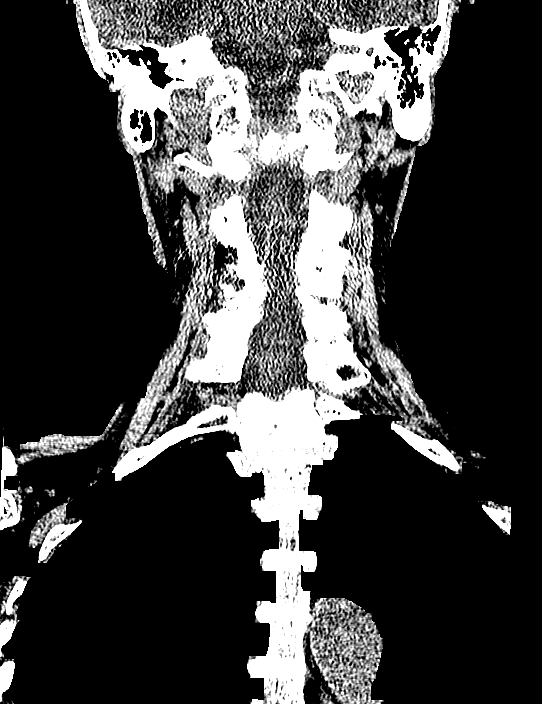
[im 23/41  brain]
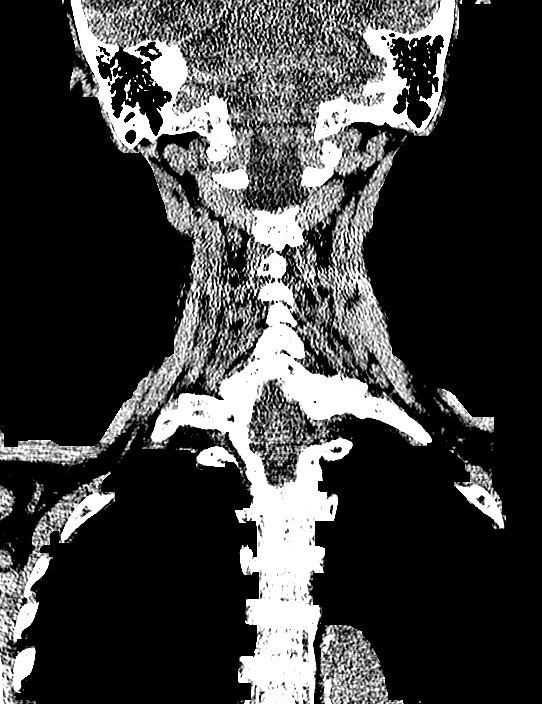

[11 of 47 positions shown; findings below may reference images not displayed]

FINDINGS: CT HEAD FINDINGS

Moderate to severe ventriculomegaly, stable from prior examination
with disproportionate symmetric temporal lobe volume loss and ex
vacuo dilatation temporal horns. No intraparenchymal hemorrhage,
mass effect nor midline shift. Patchy supratentorial white matter
hypodensities are within normal range for patient's age and though
non-specific suggest sequelae of chronic small vessel ischemic
disease. No acute large vascular territory infarcts. Symmetric basal
ganglia mineralization.

No abnormal extra-axial fluid collections. Basal cisterns are
patent. Moderate calcific atherosclerosis of the carotid siphons.

No skull fracture. The included ocular globes and orbital contents
are non-suspicious. Small RIGHT maxillary mucosal retention cyst
partially imaged. RIGHT TMJ intra-articular fluffy calcifications
can be seen with CPPD. Soft tissue opacifies the RIGHT greater than
LEFT external auditory canals, likely representing cerumen.

CT CERVICAL SPINE FINDINGS

Cervical vertebral bodies and posterior elements intact. Grade 1
C5-6 and grade 1 C7-T1 anterolisthesis on degenerative basis.
Moderate to severe C3-4 thru C7-T1 disc height loss, endplate
sclerosis of marginal spurring compatible degenerative discs.
Moderate to severe facet arthropathy. C1-2 articulation maintained
with moderate arthropathy. Small amount of calcified pannus about
the odontoid process most often associated with CPPD. No destructive
bony lesions. No prevertebral soft tissue swelling. Mild calcific
atherosclerosis of the carotid bulbs. Apical fibronodular scarring.
IMPRESSION: CT HEAD: No acute intracranial process.

Stable chronic changes including moderate to severe parenchymal
brain volume loss, disproportionate temporal lobe atrophy of which
can be seen with neurodegenerative disorders.

CT CERVICAL SPINE: No acute cervical spine fracture. Grade 1 C4-5
and grade 1 C7-T1 anterolisthesis on degenerative basis.
